# Patient Record
Sex: Female | Born: 1935 | Race: Black or African American | Hispanic: No
Health system: Southern US, Community
[De-identification: ages and names within clinical notes are randomized; demographics above are authoritative.]

## PROBLEM LIST (undated history)

## (undated) DIAGNOSIS — R42 Dizziness and giddiness: Secondary | ICD-10-CM

## (undated) DIAGNOSIS — E7800 Pure hypercholesterolemia, unspecified: Secondary | ICD-10-CM

## (undated) DIAGNOSIS — C9000 Multiple myeloma not having achieved remission: Secondary | ICD-10-CM

## (undated) DIAGNOSIS — F0390 Unspecified dementia without behavioral disturbance: Secondary | ICD-10-CM

## (undated) DIAGNOSIS — E559 Vitamin D deficiency, unspecified: Secondary | ICD-10-CM

## (undated) DIAGNOSIS — I509 Heart failure, unspecified: Secondary | ICD-10-CM

## (undated) DIAGNOSIS — F329 Major depressive disorder, single episode, unspecified: Secondary | ICD-10-CM

## (undated) DIAGNOSIS — D649 Anemia, unspecified: Secondary | ICD-10-CM

## (undated) DIAGNOSIS — F32A Depression, unspecified: Secondary | ICD-10-CM

## (undated) DIAGNOSIS — N289 Disorder of kidney and ureter, unspecified: Secondary | ICD-10-CM

---

## 2012-10-14 ENCOUNTER — Other Ambulatory Visit: Payer: Self-pay | Admitting: Podiatry

## 2012-10-18 LAB — WOUND CULTURE

## 2012-12-16 ENCOUNTER — Other Ambulatory Visit: Payer: Self-pay | Admitting: Podiatry

## 2012-12-24 LAB — WOUND CULTURE

## 2013-07-17 ENCOUNTER — Inpatient Hospital Stay: Payer: Self-pay | Admitting: Internal Medicine

## 2013-07-17 LAB — COMPREHENSIVE METABOLIC PANEL
ALK PHOS: 65 U/L
ANION GAP: 3 — AB (ref 7–16)
AST: 29 U/L (ref 15–37)
Albumin: 2.9 g/dL — ABNORMAL LOW (ref 3.4–5.0)
BILIRUBIN TOTAL: 0.2 mg/dL (ref 0.2–1.0)
BUN: 16 mg/dL (ref 7–18)
Calcium, Total: 8.5 mg/dL (ref 8.5–10.1)
Chloride: 107 mmol/L (ref 98–107)
Co2: 29 mmol/L (ref 21–32)
Creatinine: 1.2 mg/dL (ref 0.60–1.30)
EGFR (African American): 49 — ABNORMAL LOW
GFR CALC NON AF AMER: 42 — AB
Glucose: 77 mg/dL (ref 65–99)
OSMOLALITY: 278 (ref 275–301)
POTASSIUM: 4.1 mmol/L (ref 3.5–5.1)
SGPT (ALT): 21 U/L (ref 12–78)
SODIUM: 139 mmol/L (ref 136–145)
TOTAL PROTEIN: 7.2 g/dL (ref 6.4–8.2)

## 2013-07-17 LAB — CBC
HCT: 33.3 % — AB (ref 35.0–47.0)
HGB: 10.1 g/dL — ABNORMAL LOW (ref 12.0–16.0)
MCH: 25.6 pg — ABNORMAL LOW (ref 26.0–34.0)
MCHC: 30.5 g/dL — AB (ref 32.0–36.0)
MCV: 84 fL (ref 80–100)
Platelet: 225 10*3/uL (ref 150–440)
RBC: 3.96 10*6/uL (ref 3.80–5.20)
RDW: 14.1 % (ref 11.5–14.5)
WBC: 4.6 10*3/uL (ref 3.6–11.0)

## 2013-07-17 LAB — URINALYSIS, COMPLETE
BLOOD: NEGATIVE
Bacteria: NONE SEEN
Bilirubin,UR: NEGATIVE
Glucose,UR: NEGATIVE mg/dL (ref 0–75)
Hyaline Cast: 2
KETONE: NEGATIVE
Leukocyte Esterase: NEGATIVE
NITRITE: NEGATIVE
Ph: 7 (ref 4.5–8.0)
Protein: NEGATIVE
RBC,UR: 7 /HPF (ref 0–5)
Specific Gravity: 1.018 (ref 1.003–1.030)
Squamous Epithelial: <1

## 2013-07-18 LAB — TSH: THYROID STIMULATING HORM: 1.31 u[IU]/mL

## 2013-07-18 LAB — CBC WITH DIFFERENTIAL/PLATELET
BASOS ABS: 0.1 10*3/uL (ref 0.0–0.1)
Basophil %: 1.2 %
Eosinophil #: 0.2 10*3/uL (ref 0.0–0.7)
Eosinophil %: 4.5 %
HCT: 32.6 % — AB (ref 35.0–47.0)
HGB: 10.1 g/dL — ABNORMAL LOW (ref 12.0–16.0)
LYMPHS ABS: 1.4 10*3/uL (ref 1.0–3.6)
Lymphocyte %: 33.2 %
MCH: 26.2 pg (ref 26.0–34.0)
MCHC: 31.1 g/dL — AB (ref 32.0–36.0)
MCV: 84 fL (ref 80–100)
Monocyte #: 0.5 x10 3/mm (ref 0.2–0.9)
Monocyte %: 11.7 %
NEUTROS PCT: 49.4 %
Neutrophil #: 2.1 10*3/uL (ref 1.4–6.5)
Platelet: 230 10*3/uL (ref 150–440)
RBC: 3.87 10*6/uL (ref 3.80–5.20)
RDW: 14.3 % (ref 11.5–14.5)
WBC: 4.3 10*3/uL (ref 3.6–11.0)

## 2013-07-18 LAB — MAGNESIUM: Magnesium: 1.9 mg/dL

## 2013-07-18 LAB — HEMOGLOBIN A1C: HEMOGLOBIN A1C: 6 % (ref 4.2–6.3)

## 2013-07-19 LAB — CBC WITH DIFFERENTIAL/PLATELET
Basophil #: 0.1 10*3/uL (ref 0.0–0.1)
Basophil %: 0.9 %
EOS ABS: 0.2 10*3/uL (ref 0.0–0.7)
EOS PCT: 3.7 %
HCT: 30.4 % — AB (ref 35.0–47.0)
HGB: 9.6 g/dL — ABNORMAL LOW (ref 12.0–16.0)
LYMPHS ABS: 2.1 10*3/uL (ref 1.0–3.6)
Lymphocyte %: 34.5 %
MCH: 26.4 pg (ref 26.0–34.0)
MCHC: 31.5 g/dL — ABNORMAL LOW (ref 32.0–36.0)
MCV: 84 fL (ref 80–100)
MONO ABS: 0.5 x10 3/mm (ref 0.2–0.9)
MONOS PCT: 8.7 %
Neutrophil #: 3.2 10*3/uL (ref 1.4–6.5)
Neutrophil %: 52.2 %
Platelet: 216 10*3/uL (ref 150–440)
RBC: 3.63 10*6/uL — ABNORMAL LOW (ref 3.80–5.20)
RDW: 14.4 % (ref 11.5–14.5)
WBC: 6.1 10*3/uL (ref 3.6–11.0)

## 2013-07-19 LAB — BASIC METABOLIC PANEL
Anion Gap: 6 — ABNORMAL LOW (ref 7–16)
BUN: 7 mg/dL (ref 7–18)
CHLORIDE: 113 mmol/L — AB (ref 98–107)
CO2: 24 mmol/L (ref 21–32)
Calcium, Total: 7.8 mg/dL — ABNORMAL LOW (ref 8.5–10.1)
Creatinine: 0.97 mg/dL (ref 0.60–1.30)
EGFR (Non-African Amer.): 54 — ABNORMAL LOW
Glucose: 88 mg/dL (ref 65–99)
OSMOLALITY: 282 (ref 275–301)
Potassium: 4 mmol/L (ref 3.5–5.1)
Sodium: 143 mmol/L (ref 136–145)

## 2013-07-20 LAB — VANCOMYCIN, TROUGH: VANCOMYCIN, TROUGH: 6 ug/mL — AB (ref 10–20)

## 2013-07-21 LAB — WOUND AEROBIC CULTURE

## 2013-07-22 LAB — CULTURE, BLOOD (SINGLE)

## 2013-07-22 LAB — PATHOLOGY REPORT

## 2013-07-23 LAB — WOUND CULTURE

## 2014-07-30 NOTE — Consult Note (Signed)
PATIENT NAME:  Carmen Christian, Carmen Christian MR#:  [id number] DATE OF BIRTH:  05-03-1928  DATE OF CONSULTATION:  07/17/2013  CONSULTING PHYSICIAN:  Sharlotte Alamo, DPM  REASON FOR CONSULTATION: This is an 81 year old female who was recently admitted to the hospital with an infection in her right great toe. She has been managed outpatient for chronic osteomyelitis by Dr. Elvina Mattes. Has been on long-term Bactrim. She did finish her course a while back but recently within the past week the toe started to get red and inflamed again. She started back on her Bactrim 4 or 5 days ago, but the toe continues to get worse. She was admitted and x-rays and MRI showed osteomyelitis of the right great toe.   PAST MEDICAL HISTORY: Hyperlipidemia, rheumatoid arthritis, multiple myeloma, glaucoma.   PAST SURGICAL HISTORY: Cataracts.   MEDICATIONS: List is reviewed in the chart.   ALLERGIES: No known drug allergies.   FAMILY HISTORY: Heart disease, hypertension, diabetes.   SOCIAL HISTORY: Denies alcohol or tobacco use.   REVIEW OF SYSTEMS: Denies any specific fever or chills but states she does feel cold. No headaches or dizziness. Denies any specific numbness or paresthesias. Does have some swelling and redness in her right great toe. She denies any injury. Denies any nausea of or vomiting. No cough or shortness of breath. No chest pains.   PHYSICAL EXAMINATION: VASCULAR: DP and PT pulses are palpable 1/4. Capillary filling time is intact.  NEUROLOGICAL: Epicritic sensations appear grossly intact.  INTEGUMENT: Skin is warm, dry, and supple. There is a large abscessed area on the dorsal aspect of the right great toe at the IPJ. Some skin slough noted along the dorsal aspect of the toe.  MUSCULOSKELETAL: Adequate range of motion. Muscle testing is 5/5.   X-RAYS: Multiple views of the right great toe taken yesterday reveal cortical destruction consistent with osteomyelitis in the distal phalanx of the right great toe. No evidence  of any acute fracture. MRI also showed changes consistent with osteomyelitis in the distal phalanx. Some edema also noted in the proximal phalanx but there was no cortical erosion noted.   IMPRESSION: Osteomyelitis, right great toe.   PLAN: The abscess was opened up and a culture was taken for sensitivities. I discussed with the patient the need for amputation of her great toe. We discussed that this may require multiple procedures as the skin is not in very good shape along the top of her toe. The patient has not had breakfast yet this morning so we will keep her n.p.o. Consent for amputation of the right great toe. We will plan for procedure later on this morning.     Sharlotte Alamo, DPM tc:sg D: 07/18/2013 08:15:31 ET T: 07/18/2013 09:49:04 ET JOB#: [id number]  cc: Sharlotte Alamo, DPM, <Dictator> Jaeger Trueheart DPM ELECTRONICALLY SIGNED 07/27/2013 11:36

## 2014-07-30 NOTE — Op Note (Signed)
PATIENT NAME:  ODILIA, DAMICO MR#:  [id number] DATE OF BIRTH:  1929-03-09  DATE OF OPERATION:  07/18/2013  SURGEON:  Durward Fortes, DPM  PREOPERATIVE DIAGNOSIS: Osteomyelitis, right great toe.   POSTOPERATIVE DIAGNOSIS:  Osteomyelitis, right great toe.  PROCEDURE: Amputation, right great toe.   ANESTHESIA: LMA with local.  HEMOSTASIS: Pneumatic tourniquet right ankle, 250 mmHg.   ESTIMATED BLOOD LOSS: Minimal.   PATHOLOGY: Right great toe.   CULTURES: Bone cultures right distal phalanx, hallux.   IMPLANTS: Stimulan rapid cure beads with vancomycin.   DRAINS: None.   COMPLICATIONS: None apparent.   OPERATIVE INDICATIONS: This is an 81 year old female with a chronic history of osteomyelitis in her right great toe. This has been managed outpatient with oral antibiotics, but just recently flared up, requiring admission to the hospital and amputation.   OPERATIVE PROCEDURE: The patient was taken to the Operating Room and placed on the table in the supine position. Following satisfactory LMA anesthesia, the right foot was anesthetized with 10 mL of 0.5% Sensorcaine plain around the first metatarsal. A pneumatic tourniquet was applied at the level of the right ankle, and the foot was prepped and draped in the usual sterile fashion. The foot was exsanguinated and the tourniquet inflated to 250 mmHg.   Attention was then directed to the distal aspect of the right foot, where an elliptical incision fish mouth-type was made around the base of the proximal phalanx on the great toe. This was carried sharply down to bone. There was noted to be some gross infection from the distal phalanx and down along the plantar tissues. Dissection was carried back to the base of the proximal phalanx, where the base was incised using a pneumatic saw and the distal aspect of the toe was removed. The metatarsophalangeal joint was left intact. Next, using a VersaJet debrider on a setting of 7, there was excisional  debridement of the devitalized tissue from the dorsal and plantar flaps. Full thickness through the deep subcutaneous tissue all the way down to the level of bone. The wound was then flushed and irrigated with the VersaJet on a setting of 3. Some superficial skin slough along the dorsal flap was also excisionally debrided on a setting of 3. Next, Stimulan rapid cure beads impregnated with vancomycin were then placed into the open wound, and the edges were coapted and sutured using 4-0 nylon simple interrupted sutures. Xeroform and sterile bandages were applied. Tourniquet was released. ABD and Kerlix were then applied, followed by an Ace wrap. The patient tolerated the procedure and anesthesia well, and was transported to the PACU with vital signs stable and in good condition.    ____________________________ Sharlotte Alamo, DPM tc:mr D: 07/18/2013 11:26:25 ET T: 07/18/2013 19:41:31 ET JOB#: [id number]  cc: Sharlotte Alamo, DPM, <Dictator> Dezzie Badilla DPM ELECTRONICALLY SIGNED 07/27/2013 11:36

## 2014-07-30 NOTE — H&P (Signed)
PATIENT NAME:  Carmen Christian, SINQUEFIELD MR#:  [id number] DATE OF BIRTH:  [birth year]  DATE OF ADMISSION:  07/17/2013  PRIMARY CARE PHYSICIAN:  Nonlocal.   REFERRING PHYSICIAN:  Dr. Bonney Roussel.   CHIEF COMPLAINT: Right great toe tenderness and swelling for 4 days.   HISTORY OF PRESENT ILLNESS:  81 year old African American female with a history of hyperlipidemia, rheumatoid arthritis, multiple myeloma, glaucoma presented to the ED with the above chief complaint. The patient is alert, awake, oriented, in no acute distress. The patient had complaints of right great toe tenderness and swelling for the past 4 days. She has been following up with Dr. Elvina Mattes, podiatrist. She was given antibiotics 4 days ago, but the right great toe tenderness, swelling has been worsening. The patient denies any fever or chills. No other symptoms. The patient foot x-ray showed possible osteomyelitis. The patient was treated with Zosyn  and went to ED.   PAST MEDICAL HISTORY: Hyperlipidemia, rheumatoid arthritis, multiple myeloma, glaucoma.   PAST SURGICAL HISTORY:  Cataract surgery.   SOCIAL HISTORY: No smoking or drinking or illicit drugs.   FAMILY HISTORY: Mother and sister had heart attack. Brother has hypertension and diabetes.   ALLERGIES:  None.   HOME MEDICATIONS:  1.  Vitamin D2 50,000 international units 1 capsule twice a week.  2.  Tramadol 50 mg p.o. 1-2 tablets every 6 hours p.r.n.  3.  Bactrim DS 1 tablet p.o. b.i.d.  4.  Zocor 40 mg p.o. at bedtime.  5.  Mupirocin 2% topical cream applied topically to affected area 3 times a day.  6.  Integra 1 tablet once a day.  7.  Lasix 20 mg p.o. once a day p.r.n.   REVIEW OF SYSTEMS:   CONSTITUTIONAL: The patient denies any fever or chills. No headache or dizziness. No weakness.  EYES: No double vision or blurry vision.  EARS, NOSE AND THROAT: No postnasal drip, slurred speech or dysphagia.  CARDIOVASCULAR: No chest pain, palpitation, orthopnea, nocturnal dyspnea. No  leg edema.  PULMONARY: No cough, sputum, shortness of breath or hemoptysis.  GASTROINTESTINAL: No abdominal pain, nausea, vomiting, diarrhea. No melena or bloody stool.  GENITOURINARY: No dysuria, hematuria, or incontinence.  SKIN: No rash or jaundice.  NEUROLOGY: No syncope,  loss of consciousness or seizure.  HEMATOLOGY: No easy bleeding or bleeding.  ENDOCRINOLOGY: No polyuria, polydipsia, heat, or cold intolerance.   PHYSICAL EXAMINATION:  VITAL SIGNS: Temperature 97.8, blood pressure 108/62, pulse 54, respirations 18, oxygen saturation 93% on room air.  GENERAL: The patient is alert, awake, oriented acute distress.  EARS, NOSE AND THROAT:  Pupils round, equal and reactive to light in accommodation. Moist oral mucosa. Clear oropharynx.  NECK: Supple. No JVD or carotid bruits. No lymphadenopathy. No thyromegaly.  CARDIOVASCULAR: S1, S2, regular rate and rhythm. No murmurs or gallops.  PULMONARY: Bilateral air entry. No wheezing or rales. No use of accessory muscle to breathe.  GASTROINTESTINAL:  Abdomen soft. No distention. No tenderness. No organomegaly. Bowel sounds present.  EXTREMITIES: No edema, clubbing, or cyanosis. No calf tenderness. Bilateral pedal pulses present. Right great toe swelling, tenderness in dressing.   NEUROLOGY: A and O x 3. No focal deficit. Power 5/5. Sensory intact.   LABORATORY DATA:  Right foot x-ray showed destructive changes of distal phalanx of the right great toe worrisome for clinically suspected osteomyelitis.   WBC 4.6, hemoglobin 10.1, platelets 225,000. Glucose 77, BUN 16, creatinine 1.2. Electrolytes normal.   IMPRESSIONS:  1.  Right great toe osteomyelitis.  2.  Anemia.  3.  Hyperlipidemia.  4.  Rheumatoid arthritis.  5.  Multiple myeloma.   PLAN OF TREATMENT:  1.  The patient will be admitted to the medical floor. We will continue Zosyn and vancomycin IVPB. We will get a podiatry consult for possible procedure and surgery.  2.  The patient  may need an MRI foot.  3.  We will follow up CBC, blood culture.  4.  I discussed the patient's condition and plan of treatment with the patient and the patient's friend. The patient has no health power of attorney.   CODE STATUS:  The patient wants full code.   TIME SPENT: About 52 minutes.   ____________________________ Demetrios Loll, MD qc:tc D: 07/17/2013 13:45:47 ET T: 07/17/2013 14:53:08 ET JOB#: [id number]  cc: Demetrios Loll, MD, <Dictator> Demetrios Loll MD ELECTRONICALLY SIGNED 07/18/2013 10:37

## 2014-07-30 NOTE — Discharge Summary (Signed)
PATIENT NAME:  Carmen Christian, Carmen Christian MR#:  [id number] DATE OF BIRTH:  14-Apr-1928  DATE OF ADMISSION:  07/17/2013 DATE OF DISCHARGE:  07/21/2013  ADMITTING DIAGNOSIS: Right great toe tenderness and swelling for the past few days.   DISCHARGE DIAGNOSES:  1. Osteomyelitis of the right great toe, status post amputation of the right great toe.  2. Chronic anemia.  3. Hyperlipidemia.  4. Rheumatoid arthritis.  5. Multiple myeloma.   PERTINENT LABORATORIES AND EVALUATIONS: Admitting glucose 77, BUN 16, creatinine 1.20, sodium 139, potassium is 4.1, chloride 107, CO2 is 29. Hemoglobin A1c 6.0. LFTs: Total protein 7.2, albumin 2.9, bilirubin total was 0.2, alkaline phosphatase is 65. TSH 1.31. WBC 4.6, hemoglobin 10.1, platelet count is 225. Blood cultures x2: No growth. Wound culture: Showed no growth, a few white blood cells, rare gram-positive cocci in clusters. Wound cultures intraoperatively holding for possible anaerobes, appears to be normal flora.   HOSPITAL COURSE: Please refer to H and P done by the admitting physician. The patient is an 81 year old African-American female who resides by herself, with a history of hyperlipidemia, rheumatoid arthritis, multiple myeloma, glaucoma, who presented to the ED with complaint of right great toe tenderness and swelling. She has been followed by Dr. Elvina Mattes of podiatry and was given antibiotics. However, symptoms persisted. She came to the ED and was thought to have possible osteomyelitis. We were asked to admit the patient. The patient was started on broad-spectrum antibiotics and further evaluation and treatment. The patient was seen by podiatry, and they recommended the patient have this amputated. She underwent amputation of the right great toe without any complications. She was kept on antibiotics, was followed throughout the hospitalization. Cultures did not show any growth. She will be empirically treated with Bactrim. At this time, she is very weak and  deconditioned and needs further rehab and therapy. At this time, she is stable for discharge.   DISCHARGE MEDICATIONS:  1. Simvastatin 40 at bedtime. 2. Integra 1 tab p.o. daily. 3. Vitamin D2 50,000 international units 1 cap 2 times a week. 4. Bactrim 1 tab p.o. b.i.d. for 10 days. 5. Mupirocin topically apply to affected area 3 times a day.  6. Acetaminophen/hydrocodone 325/5 mg 1 tab p.o. q.8 p.r.n. for pain.  DRESSING CARE: Will be addressed per podiatry.   DIET: Low sodium.   ACTIVITY: As tolerated.   FOLLOWUP: With Dr. Cleda Mccreedy of podiatry, in 1 to 2 weeks with primary MD.  TIME SPENT: 35 minutes.   ____________________________ Lafonda Mosses Posey Pronto, MD shp:lb D: 07/21/2013 14:42:33 ET T: 07/21/2013 14:51:01 ET JOB#: [id number]  cc: Danise Dehne H. Posey Pronto, MD, <Dictator> Alric Seton MD ELECTRONICALLY SIGNED 07/22/2013 14:49

## 2017-03-03 ENCOUNTER — Encounter (HOSPITAL_COMMUNITY): Payer: Self-pay | Admitting: Emergency Medicine

## 2017-03-03 ENCOUNTER — Emergency Department (HOSPITAL_COMMUNITY)
Admission: EM | Admit: 2017-03-03 | Discharge: 2017-03-03 | Disposition: A | Discharge status: Home / Self Care | Payer: Medicare Other | Attending: Emergency Medicine | Admitting: Emergency Medicine

## 2017-03-03 ENCOUNTER — Other Ambulatory Visit: Payer: Self-pay

## 2017-03-03 DIAGNOSIS — Y999 Unspecified external cause status: Secondary | ICD-10-CM | POA: Insufficient documentation

## 2017-03-03 DIAGNOSIS — X58XXXA Exposure to other specified factors, initial encounter: Secondary | ICD-10-CM | POA: Diagnosis not present

## 2017-03-03 DIAGNOSIS — Y929 Unspecified place or not applicable: Secondary | ICD-10-CM | POA: Insufficient documentation

## 2017-03-03 DIAGNOSIS — F0390 Unspecified dementia without behavioral disturbance: Secondary | ICD-10-CM | POA: Insufficient documentation

## 2017-03-03 DIAGNOSIS — Y9389 Activity, other specified: Secondary | ICD-10-CM | POA: Diagnosis not present

## 2017-03-03 DIAGNOSIS — Z79899 Other long term (current) drug therapy: Secondary | ICD-10-CM | POA: Insufficient documentation

## 2017-03-03 DIAGNOSIS — R0989 Other specified symptoms and signs involving the circulatory and respiratory systems: Secondary | ICD-10-CM | POA: Diagnosis present

## 2017-03-03 DIAGNOSIS — T17320A Food in larynx causing asphyxiation, initial encounter: Secondary | ICD-10-CM | POA: Insufficient documentation

## 2017-03-03 HISTORY — DX: Unspecified dementia, unspecified severity, without behavioral disturbance, psychotic disturbance, mood disturbance, and anxiety: F03.90

## 2017-03-03 HISTORY — DX: Multiple myeloma not having achieved remission: C90.00

## 2017-03-03 HISTORY — DX: Disorder of kidney and ureter, unspecified: N28.9

## 2017-03-03 HISTORY — DX: Depression, unspecified: F32.A

## 2017-03-03 HISTORY — DX: Major depressive disorder, single episode, unspecified: F32.9

## 2017-03-03 HISTORY — DX: Pure hypercholesterolemia, unspecified: E78.00

## 2017-03-03 NOTE — ED Triage Notes (Signed)
Pt was choking at [facility]. Himlic Maneuver performed successfully by First responders. Pt has no complaints.

## 2017-03-03 NOTE — ED Notes (Signed)
Pt given water per fluid challenge. Pt drinking with no complaints and no noted distress.

## 2017-03-03 NOTE — ED Provider Notes (Signed)
[facility] EMERGENCY DEPARTMENT Provider Note   CSN: [id number] Arrival date & time: 03/03/17  1735     History   Chief Complaint Chief Complaint  Patient presents with  . Choking    HPI Carmen Christian is a 83 y.o. female.  HPI Patient had a choking episode while at assisted living.  Reportedly was eating chicken and had it get caught in her throat.  Had trouble swallowing.  Reportedly got Heimlich maneuver.  Patient states that then coughed up.  Then feels better.  No difficulty breathing or difficulty swallowing now.  No chest pain.  Has not had this happen before. Past Medical History:  Diagnosis Date  . Dementia   . Depression   . High cholesterol   . Multiple myeloma ([place])   . Renal disorder     There are no active problems to display for this patient.     OB History    No data available       Home Medications    Prior to Admission medications   Medication Sig Start Date End Date Taking? Authorizing Provider  Acetaminophen (TYLENOL ARTHRITIS EXT RELIEF PO) Take 650 mg by mouth daily as needed (for mild pain).   Yes [provider]  albuterol (PROVENTIL HFA;VENTOLIN HFA) 108 (90 Base) MCG/ACT inhaler Inhale 2 puffs into the lungs every 6 (six) hours as needed for wheezing or shortness of breath.   Yes [provider]  dexamethasone (DECADRON) 4 MG tablet Take 4 mg by mouth every 14 (fourteen) days.   Yes [provider]  divalproex (DEPAKOTE) 125 MG DR tablet Take 125 mg by mouth 3 (three) times daily.   Yes [provider]  Fe Fum-FePoly-FA-Vit C-Vit B3 (INTEGRA F) 125-1 MG CAPS Take 1 capsule by mouth daily.   Yes [provider]  furosemide (LASIX) 20 MG tablet Take 20 mg by mouth daily.   Yes [provider]  gabapentin (NEURONTIN) 300 MG capsule Take 300 mg by mouth 3 (three) times daily.   Yes [provider]  meclizine (ANTIVERT) 25 MG tablet Take 25 mg by mouth every 6 (six) hours as needed  for dizziness.   Yes [provider]  sertraline (ZOLOFT) 25 MG tablet Take 25 mg by mouth daily.   Yes [provider]  simvastatin (ZOCOR) 40 MG tablet Take 40 mg by mouth at bedtime.   Yes [provider]  Skin Protectants, Misc. (MINERIN) CREA Apply 1 application topically daily as needed (to lower legs for dry skin).   Yes [provider]  traZODone (DESYREL) 50 MG tablet Take 50 mg by mouth at bedtime.   Yes [provider]  Vitamin D, Ergocalciferol, (DRISDOL) 50000 units CAPS capsule Take 50,000 Units by mouth 2 (two) times a week. Mondays and Thursdays in the morning   Yes [provider]  zinc oxide 20 % ointment Apply 1 application topically 3 (three) times daily as needed for irritation (applied to bilateral buttocks).   Yes [provider]    Family History No family history on file.  Social History Social History   Tobacco Use  . Smoking status: Unknown If Ever Smoked  Substance Use Topics  . Alcohol use: Not on file  . Drug use: Not on file     Allergies   Patient has no known allergies.   Review of Systems Review of Systems  Constitutional: Negative for appetite change.  Respiratory: Negative for shortness of breath.   Cardiovascular: Negative  for chest pain.  Gastrointestinal: Negative for abdominal pain and vomiting.  Genitourinary: Negative for flank pain.  Musculoskeletal: Negative for back pain.  Skin: Negative for rash.  Neurological: Negative for weakness.  Hematological: Negative for adenopathy.  Psychiatric/Behavioral: Negative for confusion.     Physical Exam Updated Vital Signs BP 139/76   Pulse (!) 51   Temp 97.8 F (36.6 C)   Resp 18   Ht _0  (1.575 m)   Wt 54.4 kg (120 lb)   SpO2 96%   BMI 21.95 kg/m   Physical Exam  Constitutional: She appears well-developed.  HENT:  Head: Atraumatic.  Eyes: Pupils are equal, round, and reactive to light.  Neck: Neck supple.    Cardiovascular: Normal rate.  Pulmonary/Chest: She exhibits no tenderness.  Abdominal: There is no tenderness.  Musculoskeletal: She exhibits no tenderness.  Neurological: She is alert.  Skin: Skin is warm. Capillary refill takes less than 2 seconds.  Psychiatric: She has a normal mood and affect.     ED Treatments / Results  Labs (all labs ordered are listed, but only abnormal results are displayed) Labs Reviewed - No data to display  EKG  EKG Interpretation None       Radiology No results found.  Procedures Procedures (including critical care time)  Medications Ordered in ED Medications - No data to display   Initial Impression / Assessment and Plan / ED Course  I have reviewed the triage vital signs and the nursing notes.  Pertinent labs & imaging results that were available during my care of the patient were reviewed by me and considered in my medical decision making (see chart for details).     Patient with choking episode.  Got Heimlich maneuver at assisted living.  Back at baseline.  Able to tolerate orals here.  Well-appearing.  Will discharge home.  Final Clinical Impressions(s) / ED Diagnoses   Final diagnoses:  Choking due to food in larynx, initial encounter    ED Discharge Orders    None       Davonna Belling, MD 03/03/17 [phone]

## 2017-05-22 ENCOUNTER — Encounter (HOSPITAL_COMMUNITY): Payer: Self-pay | Admitting: Emergency Medicine

## 2017-05-22 ENCOUNTER — Emergency Department (HOSPITAL_COMMUNITY): Payer: Medicare Other

## 2017-05-22 ENCOUNTER — Emergency Department (HOSPITAL_COMMUNITY)
Admission: EM | Admit: 2017-05-22 | Discharge: 2017-05-22 | Disposition: A | Discharge status: Home / Self Care | Payer: Medicare Other | Attending: Emergency Medicine | Admitting: Emergency Medicine

## 2017-05-22 DIAGNOSIS — J40 Bronchitis, not specified as acute or chronic: Secondary | ICD-10-CM | POA: Insufficient documentation

## 2017-05-22 DIAGNOSIS — F0390 Unspecified dementia without behavioral disturbance: Secondary | ICD-10-CM | POA: Insufficient documentation

## 2017-05-22 DIAGNOSIS — Z79899 Other long term (current) drug therapy: Secondary | ICD-10-CM | POA: Insufficient documentation

## 2017-05-22 DIAGNOSIS — R05 Cough: Secondary | ICD-10-CM | POA: Diagnosis present

## 2017-05-22 LAB — INFLUENZA PANEL BY PCR (TYPE A & B)
INFLAPCR: NEGATIVE
Influenza B By PCR: NEGATIVE

## 2017-05-22 LAB — CBC WITH DIFFERENTIAL/PLATELET
Basophils Absolute: 0 10*3/uL (ref 0.0–0.1)
Basophils Relative: 0 %
EOS PCT: 7 %
Eosinophils Absolute: 0.6 10*3/uL (ref 0.0–0.7)
HCT: 32.9 % — ABNORMAL LOW (ref 36.0–46.0)
Hemoglobin: 10 g/dL — ABNORMAL LOW (ref 12.0–15.0)
LYMPHS PCT: 29 %
Lymphs Abs: 2.8 10*3/uL (ref 0.7–4.0)
MCH: 26.7 pg (ref 26.0–34.0)
MCHC: 30.4 g/dL (ref 30.0–36.0)
MCV: 88 fL (ref 78.0–100.0)
MONO ABS: 0.9 10*3/uL (ref 0.1–1.0)
MONOS PCT: 10 %
Neutro Abs: 5.2 10*3/uL (ref 1.7–7.7)
Neutrophils Relative %: 54 %
PLATELETS: 203 10*3/uL (ref 150–400)
RBC: 3.74 MIL/uL — ABNORMAL LOW (ref 3.87–5.11)
RDW: 14.5 % (ref 11.5–15.5)
WBC: 9.6 10*3/uL (ref 4.0–10.5)

## 2017-05-22 LAB — COMPREHENSIVE METABOLIC PANEL
ALT: 14 U/L (ref 14–54)
AST: 21 U/L (ref 15–41)
Albumin: 2.9 g/dL — ABNORMAL LOW (ref 3.5–5.0)
Alkaline Phosphatase: 62 U/L (ref 38–126)
Anion gap: 11 (ref 5–15)
BILIRUBIN TOTAL: 0.5 mg/dL (ref 0.3–1.2)
BUN: 24 mg/dL — ABNORMAL HIGH (ref 6–20)
CHLORIDE: 106 mmol/L (ref 101–111)
CO2: 28 mmol/L (ref 22–32)
CREATININE: 0.95 mg/dL (ref 0.44–1.00)
Calcium: 8.5 mg/dL — ABNORMAL LOW (ref 8.9–10.3)
GFR calc Af Amer: >60 mL/min (ref >60)
GFR, EST NON AFRICAN AMERICAN: 53 mL/min — AB (ref >60)
Glucose, Bld: 101 mg/dL — ABNORMAL HIGH (ref 65–99)
POTASSIUM: 4.4 mmol/L (ref 3.5–5.1)
Sodium: 145 mmol/L (ref 135–145)
TOTAL PROTEIN: 6.9 g/dL (ref 6.5–8.1)

## 2017-05-22 NOTE — ED Triage Notes (Signed)
Pt sent from [facility] for eval of cough/congestion and possible fever.  Started on Amoxil 2 days ago for URI with no change.

## 2017-05-22 NOTE — Discharge Instructions (Signed)
Continue taking your antibiotic and follow-up with your primary care doctor next week

## 2017-05-22 NOTE — ED Provider Notes (Signed)
[facility] EMERGENCY DEPARTMENT Provider Note   CSN: [id number] Arrival date & time: 05/22/17  1735     History   Chief Complaint Chief Complaint  Patient presents with  . Cough    HPI Carmen Christian is a 84 y.o. female.  Patient complains of a cough.  She is placed on amoxicillin couple days ago   The history is provided by the patient. No language interpreter was used.  Cough  This is a new problem. The current episode started 2 days ago. The problem occurs constantly. The problem has not changed since onset.The cough is non-productive. There has been no fever. Pertinent negatives include no chest pain and no headaches. The treatment provided no relief.    Past Medical History:  Diagnosis Date  . Dementia   . Depression   . High cholesterol   . Multiple myeloma ([place])   . Renal disorder     There are no active problems to display for this patient.   History reviewed. No pertinent surgical history.  OB History    No data available       Home Medications    Prior to Admission medications   Medication Sig Start Date End Date Taking? Authorizing Provider  Acetaminophen (TYLENOL ARTHRITIS EXT RELIEF PO) Take 650 mg by mouth daily as needed (for mild pain).   Yes [provider]  albuterol (PROVENTIL HFA;VENTOLIN HFA) 108 (90 Base) MCG/ACT inhaler Inhale 2 puffs into the lungs every 6 (six) hours as needed for wheezing or shortness of breath.   Yes [provider]  amoxicillin (AMOXIL) 500 MG capsule Take 500 mg by mouth 3 (three) times daily. 10 day course starting on 05/20/17   Yes [provider]  dexamethasone (DECADRON) 4 MG tablet Take 4 mg by mouth every 14 (fourteen) days.   Yes [provider]  diclofenac sodium (VOLTAREN) 1 % GEL Apply 2 g topically 4 (four) times daily.   Yes [provider]  divalproex (DEPAKOTE) 125 MG DR tablet Take 125 mg by mouth 3 (three) times daily.   Yes [provider]  Fe  Fum-FePoly-FA-Vit C-Vit B3 (INTEGRA F) 125-1 MG CAPS Take 1 capsule by mouth daily.   Yes [provider]  furosemide (LASIX) 20 MG tablet Take 20 mg by mouth daily.   Yes [provider]  gabapentin (NEURONTIN) 300 MG capsule Take 300 mg by mouth 3 (three) times daily.   Yes [provider]  meclizine (ANTIVERT) 25 MG tablet Take 25 mg by mouth every 6 (six) hours as needed for dizziness.   Yes [provider]  sertraline (ZOLOFT) 50 MG tablet Take 50 mg by mouth daily.    Yes [provider]  simvastatin (ZOCOR) 40 MG tablet Take 40 mg by mouth at bedtime.   Yes [provider]  Skin Protectants, Misc. (MINERIN) CREA Apply 1 application topically daily as needed (to lower legs for dry skin).   Yes [provider]  traZODone (DESYREL) 50 MG tablet Take 50 mg by mouth at bedtime.   Yes [provider]  Vitamin D, Ergocalciferol, (DRISDOL) 50000 units CAPS capsule Take 50,000 Units by mouth 2 (two) times a week. Mondays and Thursdays in the morning   Yes [provider]  zinc oxide 20 % ointment Apply 1 application topically 3 (three) times daily as needed for irritation (applied to bilateral buttocks).   Yes [provider]    Family History History reviewed. No pertinent family history.  Social History Social History   Tobacco Use  . Smoking status: Unknown If Ever Smoked  Substance Use Topics  . Alcohol use: No    Frequency: Never  . Drug use: Not on file     Allergies   Patient has no known allergies.   Review of Systems Review of Systems  Constitutional: Negative for appetite change and fatigue.  HENT: Negative for congestion, ear discharge and sinus pressure.   Eyes: Negative for discharge.  Respiratory: Positive for cough.   Cardiovascular: Negative for chest pain.  Gastrointestinal: Negative for abdominal pain and diarrhea.  Genitourinary: Negative for frequency and hematuria.    Musculoskeletal: Negative for back pain.  Skin: Negative for rash.  Neurological: Negative for seizures and headaches.  Psychiatric/Behavioral: Negative for hallucinations.     Physical Exam Updated Vital Signs BP 108/66   Pulse 74   Temp 99.2 F (37.3 C) (Oral)   Resp 14   Ht 5' 6" (1.676 m)   Wt 81.6 kg (180 lb)   SpO2 92%   BMI 29.05 kg/m   Physical Exam  Constitutional: She is oriented to person, place, and time. She appears well-developed.  HENT:  Head: Normocephalic.  Eyes: Conjunctivae and EOM are normal. No scleral icterus.  Neck: Neck supple. No thyromegaly present.  Cardiovascular: Normal rate and regular rhythm. Exam reveals no gallop and no friction rub.  No murmur heard. Pulmonary/Chest: No stridor. She has no wheezes. She has no rales. She exhibits no tenderness.  Abdominal: She exhibits no distension. There is no tenderness. There is no rebound.  Musculoskeletal: Normal range of motion. She exhibits no edema.  Lymphadenopathy:    She has no cervical adenopathy.  Neurological: She is oriented to person, place, and time. She exhibits normal muscle tone. Coordination normal.  Skin: No rash noted. No erythema.  Psychiatric: She has a normal mood and affect. Her behavior is normal.     ED Treatments / Results  Labs (all labs ordered are listed, but only abnormal results are displayed) Labs Reviewed  CBC WITH DIFFERENTIAL/PLATELET - Abnormal; Notable for the following components:      Result Value   RBC 3.74 (*)    Hemoglobin 10.0 (*)    HCT 32.9 (*)    All other components within normal limits  COMPREHENSIVE METABOLIC PANEL - Abnormal; Notable for the following components:   Glucose, Bld 101 (*)    BUN 24 (*)    Calcium 8.5 (*)    Albumin 2.9 (*)    GFR calc non Af Amer 53 (*)    All other components within normal limits  INFLUENZA PANEL BY PCR (TYPE A & B)    EKG  EKG Interpretation None       Radiology Dg Chest 2 View  Result Date:  05/22/2017 CLINICAL DATA:  Cough and congestion with shortness of Breath EXAM: CHEST  2 VIEW COMPARISON:  None. FINDINGS: Cardiac shadow is enlarged. Aortic calcifications are noted. Small pleural effusions are noted bilaterally. Mild right basilar atelectatic changes are seen. Degenerative changes about the shoulder joints as well as in the thoracic spine are noted. IMPRESSION: Mild right basilar atelectatic changes and small pleural effusions. Electronically Signed   By: Mark  Lukens M.D.   On: 05/22/2017 19:20    Procedures Procedures (including critical care time)  Medications Ordered in ED Medications - No data to display   Initial Impression / Assessment and Plan / ED Course  I have reviewed the triage vital signs and   the nursing notes.  Pertinent labs & imaging results that were available during my care of the patient were reviewed by me and considered in my medical decision making (see chart for details).     Patient with bronchitis.  She will continue the amoxicillin and follow-up her PCP  Final Clinical Impressions(s) / ED Diagnoses   Final diagnoses:  Bronchitis    ED Discharge Orders    None       , , MD 05/22/17 2102  

## 2017-05-22 NOTE — ED Notes (Signed)
Report called to [place] at [facility]. EMS called to transport pt.

## 2018-01-21 ENCOUNTER — Encounter (HOSPITAL_COMMUNITY): Payer: Self-pay

## 2018-01-21 ENCOUNTER — Other Ambulatory Visit: Payer: Self-pay

## 2018-01-21 ENCOUNTER — Emergency Department (HOSPITAL_COMMUNITY): Payer: Medicare Other

## 2018-01-21 ENCOUNTER — Emergency Department (HOSPITAL_COMMUNITY)
Admission: EM | Admit: 2018-01-21 | Discharge: 2018-01-22 | Disposition: A | Discharge status: Home / Self Care | Payer: Medicare Other | Attending: Emergency Medicine | Admitting: Emergency Medicine

## 2018-01-21 DIAGNOSIS — Y92122 Bedroom in nursing home as the place of occurrence of the external cause: Secondary | ICD-10-CM | POA: Insufficient documentation

## 2018-01-21 DIAGNOSIS — Y9389 Activity, other specified: Secondary | ICD-10-CM | POA: Diagnosis not present

## 2018-01-21 DIAGNOSIS — F0390 Unspecified dementia without behavioral disturbance: Secondary | ICD-10-CM | POA: Diagnosis not present

## 2018-01-21 DIAGNOSIS — F329 Major depressive disorder, single episode, unspecified: Secondary | ICD-10-CM | POA: Diagnosis not present

## 2018-01-21 DIAGNOSIS — Z79899 Other long term (current) drug therapy: Secondary | ICD-10-CM | POA: Diagnosis not present

## 2018-01-21 DIAGNOSIS — W0110XA Fall on same level from slipping, tripping and stumbling with subsequent striking against unspecified object, initial encounter: Secondary | ICD-10-CM | POA: Diagnosis not present

## 2018-01-21 DIAGNOSIS — Y998 Other external cause status: Secondary | ICD-10-CM | POA: Insufficient documentation

## 2018-01-21 DIAGNOSIS — L89622 Pressure ulcer of left heel, stage 2: Secondary | ICD-10-CM | POA: Insufficient documentation

## 2018-01-21 DIAGNOSIS — Z8579 Personal history of other malignant neoplasms of lymphoid, hematopoietic and related tissues: Secondary | ICD-10-CM | POA: Insufficient documentation

## 2018-01-21 DIAGNOSIS — S32502A Unspecified fracture of left pubis, initial encounter for closed fracture: Secondary | ICD-10-CM | POA: Diagnosis not present

## 2018-01-21 DIAGNOSIS — W19XXXA Unspecified fall, initial encounter: Secondary | ICD-10-CM

## 2018-01-21 DIAGNOSIS — S32592A Other specified fracture of left pubis, initial encounter for closed fracture: Secondary | ICD-10-CM

## 2018-01-21 DIAGNOSIS — S3993XA Unspecified injury of pelvis, initial encounter: Secondary | ICD-10-CM | POA: Diagnosis present

## 2018-01-21 DIAGNOSIS — R531 Weakness: Secondary | ICD-10-CM

## 2018-01-21 HISTORY — DX: Vitamin D deficiency, unspecified: E55.9

## 2018-01-21 HISTORY — DX: Heart failure, unspecified: I50.9

## 2018-01-21 HISTORY — DX: Dizziness and giddiness: R42

## 2018-01-21 HISTORY — DX: Anemia, unspecified: D64.9

## 2018-01-21 HISTORY — DX: Pure hypercholesterolemia, unspecified: E78.00

## 2018-01-21 LAB — CBC WITH DIFFERENTIAL/PLATELET
Abs Immature Granulocytes: 0.03 10*3/uL (ref 0.00–0.07)
BASOS PCT: 1 %
Basophils Absolute: 0.1 10*3/uL (ref 0.0–0.1)
EOS PCT: 4 %
Eosinophils Absolute: 0.3 10*3/uL (ref 0.0–0.5)
HCT: 32.3 % — ABNORMAL LOW (ref 36.0–46.0)
Hemoglobin: 9.5 g/dL — ABNORMAL LOW (ref 12.0–15.0)
Immature Granulocytes: 0 %
Lymphocytes Relative: 25 %
Lymphs Abs: 2.3 10*3/uL (ref 0.7–4.0)
MCH: 24.9 pg — AB (ref 26.0–34.0)
MCHC: 29.4 g/dL — ABNORMAL LOW (ref 30.0–36.0)
MCV: 84.8 fL (ref 80.0–100.0)
MONO ABS: 0.5 10*3/uL (ref 0.1–1.0)
MONOS PCT: 5 %
Neutro Abs: 6 10*3/uL (ref 1.7–7.7)
Neutrophils Relative %: 65 %
PLATELETS: 221 10*3/uL (ref 150–400)
RBC: 3.81 MIL/uL — ABNORMAL LOW (ref 3.87–5.11)
RDW: 18.6 % — AB (ref 11.5–15.5)
WBC: 9.1 10*3/uL (ref 4.0–10.5)
nRBC: 0 % (ref 0.0–0.2)

## 2018-01-21 LAB — BASIC METABOLIC PANEL
ANION GAP: 8 (ref 5–15)
BUN: 27 mg/dL — ABNORMAL HIGH (ref 8–23)
CALCIUM: 8.8 mg/dL — AB (ref 8.9–10.3)
CO2: 28 mmol/L (ref 22–32)
Chloride: 104 mmol/L (ref 98–111)
Creatinine, Ser: 0.99 mg/dL (ref 0.44–1.00)
GFR calc Af Amer: 58 mL/min — ABNORMAL LOW (ref >60)
GFR, EST NON AFRICAN AMERICAN: 50 mL/min — AB (ref >60)
GLUCOSE: 113 mg/dL — AB (ref 70–99)
Potassium: 4.3 mmol/L (ref 3.5–5.1)
Sodium: 140 mmol/L (ref 135–145)

## 2018-01-21 LAB — VALPROIC ACID LEVEL: Valproic Acid Lvl: 26 ug/mL — ABNORMAL LOW (ref 50.0–100.0)

## 2018-01-21 LAB — TROPONIN I: Troponin I: <0.03 ng/mL (ref <0.03)

## 2018-01-21 MED ORDER — CIPROFLOXACIN HCL 250 MG PO TABS: 250.0000 mg | ORAL_TABLET | Freq: Two times a day (BID) | ORAL | 0 refills | Status: DC

## 2018-01-21 MED ORDER — SODIUM CHLORIDE 0.9 % IV SOLN
INTRAVENOUS | Status: DC
  Administered 2018-01-21: 20:00:00 via INTRAVENOUS

## 2018-01-21 NOTE — ED Notes (Signed)
Patient still in Radiology

## 2018-01-21 NOTE — ED Notes (Signed)
No pacemaker 

## 2018-01-21 NOTE — Discharge Instructions (Addendum)
Take over-the-counter medications as needed for the pain, continue to apply a dressing to the heel and avoid pressure to that area, continue  your current medications

## 2018-01-21 NOTE — ED Notes (Signed)
Report given to Reggie, RN

## 2018-01-21 NOTE — ED Provider Notes (Signed)
[facility] EMERGENCY DEPARTMENT Provider Note   CSN: [id number] Arrival date & time: 01/21/18  1730     History   Chief Complaint Chief Complaint  Patient presents with  . Fall    HPI Carmen Christian is a 84 y.o. female.  HPI Pt is a resident of a nursing facility.  She has a history of demential limiting the history.  Patient was found on the floor next to her bed.  It is unclear what happened.  Per EMS the patient was complaining of pain and was noted to have a deformity of her left knee.  Patient denies any knee pain to me.  She complains of pain at her left heel.  History is somewhat difficult.  Patient does answer my questions but at times is mumbling and difficult to comprehend.  She denies any chest pain or shortness of breath.  She denies any headache or neck pain. Past Medical History:  Diagnosis Date  . Anemia   . CHF (congestive heart failure) ([place])   . Dementia ([place])   . Depression   . Dizziness   . High cholesterol   . Hypercholesterolemia   . Multiple myeloma ([place])   . Renal disorder   . Vitamin D deficiency     There are no active problems to display for this patient.   History reviewed. No pertinent surgical history.   OB History   None      Home Medications    Prior to Admission medications   Medication Sig Start Date End Date Taking? Authorizing Provider  diclofenac sodium (VOLTAREN) 1 % GEL Apply 2 g topically 4 (four) times daily.   Yes [provider]  divalproex (DEPAKOTE) 125 MG DR tablet Take 125 mg by mouth 3 (three) times daily.   Yes [provider]  Fe Fum-FePoly-FA-Vit C-Vit B3 (INTEGRA F) 125-1 MG CAPS Take 1 capsule by mouth daily.   Yes [provider]  furosemide (LASIX) 20 MG tablet Take 20 mg by mouth daily.   Yes [provider]  gabapentin (NEURONTIN) 300 MG capsule Take 300 mg by mouth 3 (three) times daily.   Yes [provider]  multivitamin-lutein (OCUVITE-LUTEIN) CAPS capsule  Take 1 capsule by mouth daily.   Yes [provider]  sertraline (ZOLOFT) 50 MG tablet Take 50 mg by mouth daily.    Yes [provider]  simvastatin (ZOCOR) 40 MG tablet Take 40 mg by mouth at bedtime.   Yes [provider]  Skin Protectants, Misc. (MINERIN) CREA Apply 1 application topically daily as needed (to lower legs for dry skin).   Yes [provider]  traZODone (DESYREL) 50 MG tablet Take 50 mg by mouth at bedtime.   Yes [provider]  Vitamin D, Ergocalciferol, (DRISDOL) 50000 units CAPS capsule Take 50,000 Units by mouth 2 (two) times a week. Mondays and Thursdays in the morning   Yes [provider]  zinc oxide 20 % ointment Apply 1 application topically 3 (three) times daily as needed for irritation (applied to bilateral buttocks).   Yes [provider]  Acetaminophen (TYLENOL ARTHRITIS EXT RELIEF PO) Take 650 mg by mouth daily as needed (for mild pain).    [provider]  albuterol (PROVENTIL HFA;VENTOLIN HFA) 108 (90 Base) MCG/ACT inhaler Inhale 2 puffs into the lungs every 6 (six) hours as needed for wheezing or shortness of breath.    [provider]  amoxicillin (AMOXIL) 500 MG capsule Take 500 mg by mouth  3 (three) times daily. 10 day course starting on 05/20/17    [provider]  azithromycin (ZITHROMAX) 250 MG tablet Take 250 mg by mouth 2 (two) times daily. Take 2 tablets po today then 1 tablet daily for 4 days    [provider]  meclizine (ANTIVERT) 25 MG tablet Take 25 mg by mouth every 6 (six) hours as needed for dizziness.    [provider]  sulfamethoxazole-trimethoprim (BACTRIM DS,SEPTRA DS) 800-160 MG tablet TAKE 1 TABLET BY MOUTH TWICE A DAY FOR 7 DAYS 11/21/17   [provider]    Family History No family history on file.  Social History Social History   Tobacco Use  . Smoking status: Unknown If Ever Smoked  . Smokeless tobacco: Never Used    Substance Use Topics  . Alcohol use: No    Frequency: Never  . Drug use: Never     Allergies   Patient has no known allergies.   Review of Systems Review of Systems  All other systems reviewed and are negative.    Physical Exam Updated Vital Signs BP 106/66   Pulse 78   Temp 98.5 F (36.9 C) (Oral)   Resp 14   SpO2 98%   Physical Exam  Constitutional: No distress.  HENT:  Head: Normocephalic and atraumatic.  Right Ear: External ear normal.  Left Ear: External ear normal.  Eyes: Conjunctivae are normal. Right eye exhibits no discharge. Left eye exhibits no discharge. No scleral icterus.  Neck: Normal range of motion. Neck supple. No tracheal deviation present.  Old range of motion of the neck without pain or discomfort  Cardiovascular: Normal rate, regular rhythm and intact distal pulses.  Pulmonary/Chest: Effort normal and breath sounds normal. No stridor. No respiratory distress. She has no wheezes. She has no rales.  Abdominal: Soft. Bowel sounds are normal. She exhibits no distension. There is no tenderness. There is no rebound and no guarding.  Musculoskeletal: She exhibits no edema or deformity.       Right shoulder: She exhibits no tenderness, no bony tenderness and no swelling.       Left shoulder: She exhibits no tenderness, no bony tenderness and no swelling.       Right wrist: She exhibits no tenderness, no bony tenderness and no swelling.       Left wrist: She exhibits no tenderness, no bony tenderness and no swelling.       Right hip: She exhibits normal range of motion, no tenderness, no bony tenderness and no swelling.       Left hip: She exhibits normal range of motion, no tenderness and no bony tenderness.       Right knee: No tenderness found.       Left knee: No tenderness found.       Right ankle: She exhibits no swelling. No tenderness.       Left ankle: She exhibits no swelling. No tenderness.       Cervical back: She exhibits no tenderness, no  bony tenderness and no swelling.       Thoracic back: She exhibits no tenderness, no bony tenderness and no swelling.       Lumbar back: She exhibits no tenderness, no bony tenderness and no swelling.  No pain with range of motion of the hips or knees bilaterally, no gross deformity noted on my exam, no edema or ecchymoses; patient does have a small ulcerative wound on the left heel that is tender to palpation  Neurological: She is alert. She has normal strength. No cranial nerve deficit (no facial droop, extraocular movements intact, no slurred speech) or sensory deficit. She exhibits normal muscle tone. She displays no seizure activity. Coordination normal. GCS eye subscore is 4. GCS verbal subscore is 4. GCS motor subscore is 6.  She has equal grip strength bilaterally, she has no facial droop noted, she is able to plantar flex but patient is able to sit up in bed with some assistance, normal sensation, she does follow commands  Skin: Skin is warm and dry. No rash noted.  Psychiatric: She has a normal mood and affect.  Nursing note and vitals reviewed.    ED Treatments / Results  Labs (all labs ordered are listed, but only abnormal results are displayed) Labs Reviewed  BASIC METABOLIC PANEL - Abnormal; Notable for the following components:      Result Value   Glucose, Bld 113 (*)    BUN 27 (*)    Calcium 8.8 (*)    GFR calc non Af Amer 50 (*)    GFR calc Af Amer 58 (*)    All other components within normal limits  CBC WITH DIFFERENTIAL/PLATELET - Abnormal; Notable for the following components:   RBC 3.81 (*)    Hemoglobin 9.5 (*)    HCT 32.3 (*)    MCH 24.9 (*)    MCHC 29.4 (*)    RDW 18.6 (*)    All other components within normal limits  VALPROIC ACID LEVEL - Abnormal; Notable for the following components:   Valproic Acid Lvl 26 (*)    All other components within normal limits  TROPONIN I    EKG EKG Interpretation  Date/Time:  Wednesday January 21 2018 18:14:51  EDT Ventricular Rate:  81 PR Interval:    QRS Duration: 92 QT Interval:  384 QTC Calculation: 446 R Axis:   7 Text Interpretation:  Sinus rhythm LVH by voltage No old tracing to compare Confirmed by Dorie Rank [phone]) on 01/21/2018 6:26:16 PM   Radiology Dg Chest 1 View  Result Date: 01/21/2018 CLINICAL DATA:  84 year old female found down. EXAM: CHEST  1 VIEW COMPARISON:  Chest radiographs 05/22/2017. FINDINGS: AP supine view at 1827 hours. Stable cardiomegaly and mediastinal contours. Calcified aortic atherosclerosis. Visualized tracheal air column is within normal limits. Stable lung volumes. Allowing for portable technique the lungs are clear. Severe degenerative changes at both shoulders. No acute osseous abnormality identified. IMPRESSION: 1. No acute cardiopulmonary abnormality or acute traumatic injury identified. 2.  Aortic Atherosclerosis (ICD10-I70.0). Electronically Signed   By: Genevie Ann M.D.   On: 01/21/2018 19:52   Ct Head Wo Contrast  Result Date: 01/21/2018 CLINICAL DATA:  84 year old female found down. EXAM: CT HEAD WITHOUT CONTRAST TECHNIQUE: Contiguous axial images were obtained from the base of the skull through the vertex without intravenous contrast. COMPARISON:  None. FINDINGS: Brain: Patchy bilateral cerebral white matter hypodensity. Generalized cerebral volume loss. No midline shift, ventriculomegaly, mass effect, evidence of mass lesion, intracranial hemorrhage or evidence of cortically based acute infarction. No cortical encephalomalacia identified. Vascular: Calcified atherosclerosis at the skull base. No suspicious intracranial vascular hyperdensity. Skull: Severe degenerative changes at the left TMJ and visible upper cervical spine. No skull fracture identified. Sinuses/Orbits: Visualized paranasal sinuses and mastoids are well pneumatized. Other: No acute orbit or scalp soft tissue findings. IMPRESSION: 1.  No acute intracranial abnormality. 2. Generalized cerebral  volume loss. Mild to moderate for age white matter changes which are most commonly due  to small vessel disease. Electronically Signed   By: Genevie Ann M.D.   On: 01/21/2018 19:54   Dg Knee Complete 4 Views Left  Result Date: 01/21/2018 CLINICAL DATA:  84 year old female found down. EXAM: LEFT KNEE - COMPLETE 4+ VIEW COMPARISON:  None. FINDINGS: Complete medial compartment joint space loss with bone on bone appearance. Subchondral sclerosis and sclerosis scratched at subchondral sclerosis and osteophytosis. Moderate to severe lateral compartment joint space loss with spurring. Relatively preserved medial compartment. Patella appears intact. No acute osseous abnormality identified. Calcified peripheral vascular disease. IMPRESSION: 1.  No acute fracture or dislocation identified about the left knee. 2. Severe medial compartment degeneration. Calcified peripheral vascular disease. Electronically Signed   By: Genevie Ann M.D.   On: 01/21/2018 19:51   Dg Foot Complete Left  Result Date: 01/21/2018 CLINICAL DATA:  84 year old female found down. EXAM: LEFT FOOT - COMPLETE 3+ VIEW COMPARISON:  None. FINDINGS: Osteopenia. Joint spaces and alignment appear normal for age. No fracture or dislocation identified. There is generalized soft tissue swelling in the distal foot. Calcified peripheral vascular disease at the ankle. No soft tissue gas. IMPRESSION: Soft tissue swelling with no No acute fracture or dislocation identified about the left foot. Electronically Signed   By: Genevie Ann M.D.   On: 01/21/2018 19:47   Dg Hips Bilat W Or Wo Pelvis 2 Views  Result Date: 01/21/2018 CLINICAL DATA:  84 year old female found down. EXAM: DG HIP (WITH OR WITHOUT PELVIS) 2V BILAT COMPARISON:  None. FINDINGS: Supine AP and cross-table lateral views. There is bulky callus formation along the medial aspect of both femurs which appears contiguous with the inferior pubic ramus on the left. The femoral heads are normally located. No  proximal femur fracture is identified. There is an age indeterminate fracture of the right inferior pubic ramus. Questionable acute fracture of the left superior pubic ramus near the pubic symphysis on image 4. No other pelvic fracture identified. Aortic and iliofemoral calcified atherosclerosis. Rounded left hemipelvis calcifications likely due to fibroids. Nonobstructed visible bowel gas pattern. IMPRESSION: 1. Age indeterminate fracture of the right inferior pubic ramus, and possible superimposed acute fracture of the left superior pubic ramus. 2. No acute fracture or dislocation of the proximal femurs. Bulky dystrophic calcification along the lesser trochanters. 3.  Aortic Atherosclerosis (ICD10-I70.0).  Fibroid uterus suspected. Electronically Signed   By: Genevie Ann M.D.   On: 01/21/2018 19:50    Procedures Procedures (including critical care time)  Medications Ordered in ED Medications  0.9 %  sodium chloride infusion ( Intravenous New Bag/Given 01/21/18 1932)     Initial Impression / Assessment and Plan / ED Course  I have reviewed the triage vital signs and the nursing notes.  Pertinent labs & imaging results that were available during my care of the patient were reviewed by me and considered in my medical decision making (see chart for details).  Clinical Course as of Jan 22 2156  Wed Jan 21, 2018  2115 Anemia is stable compared to previous values  CBC WITH DIFFERENTIAL(!) [JK]    Clinical Course User Index [JK] Dorie Rank, MD    Patient is a resident of a nursing facility.  She was found next to her bed on the floor.  Patient the emergency room appeared to be at her baseline.  No acute neurologic findings.  Her laboratory tests are stable.  CT scan does not show any evidence of injury.  I doubt stroke based on her findings.  X-rays do  show possible pubic ramus fracture.  Patient states she has not been ambulating recently because of the sore on her heel.  Does appear to have a small  decubitus ulcer without signs of infection.  Patient appears stable to discharge back to the nursing facility.  They can continue with physical therapy treatment for her pelvic fracture.  Final Clinical Impressions(s) / ED Diagnoses   Final diagnoses:  Pubic ramus fracture, left, closed, initial encounter ([place])  Pressure injury of left heel, stage 2 ([place])  Fall, initial encounter      Dorie Rank, MD 01/21/18 2157

## 2018-01-21 NOTE — ED Triage Notes (Signed)
Pt resident of [facility]  Reports staff found pt on the floor beside her bed.  Deformity noted to left knee.  Pt moaning.  EMS administered 22mcg of fentanyl pta.  EMS says staff tried to get out of bed unassisted.  Reports pt has dementia.

## 2018-04-03 ENCOUNTER — Emergency Department (HOSPITAL_COMMUNITY): Payer: Medicare Other

## 2018-04-03 ENCOUNTER — Emergency Department (HOSPITAL_COMMUNITY)
Admission: EM | Admit: 2018-04-03 | Discharge: 2018-04-03 | Disposition: A | Discharge status: Skilled Nursing Facility | Payer: Medicare Other | Attending: Emergency Medicine | Admitting: Emergency Medicine

## 2018-04-03 ENCOUNTER — Encounter (HOSPITAL_COMMUNITY): Payer: Self-pay | Admitting: Emergency Medicine

## 2018-04-03 ENCOUNTER — Other Ambulatory Visit: Payer: Self-pay

## 2018-04-03 DIAGNOSIS — L989 Disorder of the skin and subcutaneous tissue, unspecified: Secondary | ICD-10-CM

## 2018-04-03 LAB — URINALYSIS, ROUTINE W REFLEX MICROSCOPIC
Bilirubin Urine: NEGATIVE
GLUCOSE, UA: NEGATIVE mg/dL
HGB URINE DIPSTICK: NEGATIVE
Ketones, ur: 5 mg/dL — AB
LEUKOCYTES UA: NEGATIVE
Nitrite: NEGATIVE
PROTEIN: NEGATIVE mg/dL
SPECIFIC GRAVITY, URINE: 1.023 (ref 1.005–1.030)
pH: 5 (ref 5.0–8.0)

## 2018-04-03 NOTE — ED Notes (Signed)
Patient transported to X-ray 

## 2018-04-03 NOTE — ED Triage Notes (Signed)
Patient arrived via [facility] from [facility] assisted living center. She has a wound on her left foot with drainage and edema that has been worse the last several days. The patient has an alt mental status at baseline but has become more agitated today.

## 2018-04-03 NOTE — Discharge Instructions (Addendum)
Clean and heal twice a day with peroxide and bandage.  Follow-up with your doctor in a week for recheck

## 2018-04-03 NOTE — ED Provider Notes (Signed)
[facility] EMERGENCY DEPARTMENT Provider Note   CSN: [id number] Arrival date & time: 04/03/18  1659     History   Chief Complaint Chief Complaint  Patient presents with  . Wound Check    HPI Carmen Christian is a 84 y.o. female.  Patient was brought over from the nursing home for questions about her heel.  She is demented.  The history is provided by the nursing home. No language interpreter was used.  Wound Check  This is a new problem. The current episode started more than 2 days ago. The problem occurs constantly. The problem has not changed since onset.Pertinent negatives include no shortness of breath. Nothing aggravates the symptoms.    Past Medical History:  Diagnosis Date  . Anemia   . CHF (congestive heart failure) ([place])   . Dementia ([place])   . Depression   . Dizziness   . High cholesterol   . Hypercholesterolemia   . Multiple myeloma ([place])   . Renal disorder   . Vitamin D deficiency     There are no active problems to display for this patient.   History reviewed. No pertinent surgical history.   OB History   No obstetric history on file.      Home Medications    Prior to Admission medications   Medication Sig Start Date End Date Taking? Authorizing Provider  Acetaminophen (TYLENOL ARTHRITIS EXT RELIEF PO) Take 650 mg by mouth daily as needed (for mild pain).    [provider]  albuterol (PROVENTIL HFA;VENTOLIN HFA) 108 (90 Base) MCG/ACT inhaler Inhale 2 puffs into the lungs every 6 (six) hours as needed for wheezing or shortness of breath.    [provider]  amoxicillin (AMOXIL) 500 MG capsule Take 500 mg by mouth 3 (three) times daily. 10 day course starting on 05/20/17    [provider]  azithromycin (ZITHROMAX) 250 MG tablet Take 250 mg by mouth 2 (two) times daily. Take 2 tablets po today then 1 tablet daily for 4 days    [provider]  diclofenac sodium (VOLTAREN) 1 % GEL Apply 2 g topically 4 (four) times  daily.    [provider]  divalproex (DEPAKOTE) 125 MG DR tablet Take 125 mg by mouth 3 (three) times daily.    [provider]  Fe Fum-FePoly-FA-Vit C-Vit B3 (INTEGRA F) 125-1 MG CAPS Take 1 capsule by mouth daily.    [provider]  furosemide (LASIX) 20 MG tablet Take 20 mg by mouth daily.    [provider]  gabapentin (NEURONTIN) 300 MG capsule Take 300 mg by mouth 3 (three) times daily.    [provider]  meclizine (ANTIVERT) 25 MG tablet Take 25 mg by mouth every 6 (six) hours as needed for dizziness.    [provider]  multivitamin-lutein (OCUVITE-LUTEIN) CAPS capsule Take 1 capsule by mouth daily.    [provider]  sertraline (ZOLOFT) 50 MG tablet Take 50 mg by mouth daily.     [provider]  simvastatin (ZOCOR) 40 MG tablet Take 40 mg by mouth at bedtime.    [provider]  Skin Protectants, Misc. (MINERIN) CREA Apply 1 application topically daily as needed (to lower legs for dry skin).    [provider]  sulfamethoxazole-trimethoprim (BACTRIM DS,SEPTRA DS) 800-160 MG tablet TAKE 1 TABLET BY MOUTH TWICE A DAY FOR 7 DAYS 11/21/17   [provider]  traZODone (DESYREL) 50 MG tablet Take 50 mg by mouth at  bedtime.    [provider]  Vitamin D, Ergocalciferol, (DRISDOL) 50000 units CAPS capsule Take 50,000 Units by mouth 2 (two) times a week. Mondays and Thursdays in the morning    [provider]  zinc oxide 20 % ointment Apply 1 application topically 3 (three) times daily as needed for irritation (applied to bilateral buttocks).    [provider]    Family History No family history on file.  Social History Social History   Tobacco Use  . Smoking status: Unknown If Ever Smoked  . Smokeless tobacco: Never Used  Substance Use Topics  . Alcohol use: No    Frequency: Never  . Drug use: Never     Allergies   Patient has no known  allergies.   Review of Systems Review of Systems  Unable to perform ROS: Dementia  Respiratory: Negative for shortness of breath.      Physical Exam Updated Vital Signs BP 115/77   Pulse 93   Temp 98.4 F (36.9 C) (Oral)   Resp 18   SpO2 95%   Physical Exam Constitutional:      Appearance: She is well-developed.  HENT:     Head: Normocephalic.     Nose: Nose normal.  Eyes:     Conjunctiva/sclera: Conjunctivae normal.  Neck:     Trachea: No tracheal deviation.  Cardiovascular:     Heart sounds: No murmur.  Pulmonary:     Breath sounds: No wheezing.  Abdominal:     Tenderness: There is no abdominal tenderness.  Musculoskeletal: Normal range of motion.     Comments: Small ulcer on heel  Skin:    General: Skin is warm.  Neurological:     Mental Status: She is alert and oriented to person, place, and time.      ED Treatments / Results  Labs (all labs ordered are listed, but only abnormal results are displayed) Labs Reviewed  URINALYSIS, ROUTINE W REFLEX MICROSCOPIC - Abnormal; Notable for the following components:      Result Value   APPearance HAZY (*)    Ketones, ur 5 (*)    All other components within normal limits    EKG None  Radiology Dg Foot Complete Left  Result Date: 04/03/2018 CLINICAL DATA:  wound on her left foot with drainage and edema that has been worse the last several days. The patient has an alt mental status at baseline but has become more agitated today. Pt was held for all images, agitated, fought against each view. EXAM: LEFT FOOT - COMPLETE 3+ VIEW COMPARISON:  It has 01/21/2018 FINDINGS: There is significant soft tissue swelling of the entire foot, particularly involving the forefoot. No radiopaque foreign body or soft tissue gas. Bones are radiolucent. No cortical erosion identified to indicate presence of osteomyelitis. IMPRESSION: Significant soft tissue swelling.  No acute osseous abnormality. Electronically Signed   By: Nolon Nations M.D.   On: 04/03/2018 18:48    Procedures Procedures (including critical care time)  Medications Ordered in ED Medications - No data to display   Initial Impression / Assessment and Plan / ED Course  I have reviewed the triage vital signs and the nursing notes.  Pertinent labs & imaging results that were available during my care of the patient were reviewed by me and considered in my medical decision making (see chart for details).     X-ray of foot unremarkable.  Patient has a small pressure ulcer right heel that does not look infected.  She will get it cleaned couple times a day with peroxide and follow-up with her doctor Final Clinical Impressions(s) / ED Diagnoses   Final diagnoses:  Skin abnormality    ED Discharge Orders    None       Milton Ferguson, MD 04/03/18 [phone]

## 2018-04-03 NOTE — ED Notes (Addendum)
Nonadhesive dressing applied to left foot.

## 2018-04-05 ENCOUNTER — Emergency Department (HOSPITAL_COMMUNITY): Payer: Medicare Other

## 2018-04-05 ENCOUNTER — Other Ambulatory Visit: Payer: Self-pay

## 2018-04-05 ENCOUNTER — Emergency Department (HOSPITAL_COMMUNITY)
Admission: EM | Admit: 2018-04-05 | Discharge: 2018-04-06 | Disposition: A | Discharge status: Home / Self Care | Payer: Medicare Other | Attending: Emergency Medicine | Admitting: Emergency Medicine

## 2018-04-05 ENCOUNTER — Encounter (HOSPITAL_COMMUNITY): Payer: Self-pay | Admitting: *Deleted

## 2018-04-05 DIAGNOSIS — L97429 Non-pressure chronic ulcer of left heel and midfoot with unspecified severity: Secondary | ICD-10-CM | POA: Diagnosis not present

## 2018-04-05 DIAGNOSIS — N3000 Acute cystitis without hematuria: Secondary | ICD-10-CM | POA: Diagnosis not present

## 2018-04-05 DIAGNOSIS — L089 Local infection of the skin and subcutaneous tissue, unspecified: Secondary | ICD-10-CM

## 2018-04-05 DIAGNOSIS — Z79899 Other long term (current) drug therapy: Secondary | ICD-10-CM | POA: Insufficient documentation

## 2018-04-05 DIAGNOSIS — I509 Heart failure, unspecified: Secondary | ICD-10-CM | POA: Insufficient documentation

## 2018-04-05 DIAGNOSIS — R451 Restlessness and agitation: Secondary | ICD-10-CM | POA: Diagnosis present

## 2018-04-05 DIAGNOSIS — T148XXA Other injury of unspecified body region, initial encounter: Secondary | ICD-10-CM

## 2018-04-05 DIAGNOSIS — F0390 Unspecified dementia without behavioral disturbance: Secondary | ICD-10-CM | POA: Insufficient documentation

## 2018-04-05 NOTE — ED Provider Notes (Signed)
[facility] EMERGENCY DEPARTMENT Provider Note   CSN: [id number] Arrival date & time: 04/05/18  2212     History   Chief Complaint Chief Complaint  Patient presents with  . Agitation    HPI Carmen Christian is a 84 y.o. female presenting from a local nursing facility with reports that she has had increased agitation this evening.  At baseline patient has dementia and is bedbound, other medical history includes CHF, multiple myeloma and renal insufficiency.  She was seen here several days ago and was diagnosed with a small pressure ulcer on her left posterior heel which is been treated with dressing changes daily.  She has had no known fevers, no vomiting, no other recent illnesses.  Patient is unable to contribute to her history or reason for her increased agitation given her advanced dementia.  She was refusing to take her medications this evening and was transported here.  The history is provided by the nursing home and medical records. The history is limited by the condition of the patient.    Past Medical History:  Diagnosis Date  . Anemia   . CHF (congestive heart failure) ([place])   . Dementia ([place])   . Depression   . Dizziness   . High cholesterol   . Hypercholesterolemia   . Multiple myeloma ([place])   . Renal disorder   . Vitamin D deficiency     There are no active problems to display for this patient.   History reviewed. No pertinent surgical history.   OB History   No obstetric history on file.      Home Medications    Prior to Admission medications   Medication Sig Start Date End Date Taking? Authorizing Provider  Acetaminophen (TYLENOL ARTHRITIS EXT RELIEF PO) Take 650 mg by mouth daily as needed (for mild pain).    [provider]  albuterol (PROVENTIL HFA;VENTOLIN HFA) 108 (90 Base) MCG/ACT inhaler Inhale 2 puffs into the lungs every 6 (six) hours as needed for wheezing or shortness of breath.    [provider]  amoxicillin (AMOXIL) 500  MG capsule Take 500 mg by mouth 3 (three) times daily. 10 day course starting on 05/20/17    [provider]  azithromycin (ZITHROMAX) 250 MG tablet Take 250 mg by mouth 2 (two) times daily. Take 2 tablets po today then 1 tablet daily for 4 days    [provider]  diclofenac sodium (VOLTAREN) 1 % GEL Apply 2 g topically 4 (four) times daily.    [provider]  divalproex (DEPAKOTE) 125 MG DR tablet Take 125 mg by mouth 3 (three) times daily.    [provider]  Fe Fum-FePoly-FA-Vit C-Vit B3 (INTEGRA F) 125-1 MG CAPS Take 1 capsule by mouth daily.    [provider]  furosemide (LASIX) 20 MG tablet Take 20 mg by mouth daily.    [provider]  gabapentin (NEURONTIN) 300 MG capsule Take 300 mg by mouth 3 (three) times daily.    [provider]  meclizine (ANTIVERT) 25 MG tablet Take 25 mg by mouth every 6 (six) hours as needed for dizziness.    [provider]  multivitamin-lutein (OCUVITE-LUTEIN) CAPS capsule Take 1 capsule by mouth daily.    [provider]  sertraline (ZOLOFT) 50 MG tablet Take 50 mg by mouth daily.     [provider]  simvastatin (ZOCOR) 40 MG tablet Take 40 mg by mouth at bedtime.    [provider]  Skin  Protectants, Misc. (MINERIN) CREA Apply 1 application topically daily as needed (to lower legs for dry skin).    [provider]  sulfamethoxazole-trimethoprim (BACTRIM DS,SEPTRA DS) 800-160 MG tablet Take 1 tablet by mouth 2 (two) times daily for 10 days. 04/06/18 04/16/18  Evalee Jefferson, PA-C  traZODone (DESYREL) 50 MG tablet Take 50 mg by mouth at bedtime.    [provider]  Vitamin D, Ergocalciferol, (DRISDOL) 50000 units CAPS capsule Take 50,000 Units by mouth 2 (two) times a week. Mondays and Thursdays in the morning    [provider]  zinc oxide 20 % ointment Apply 1 application topically 3 (three) times daily as needed for irritation (applied to  bilateral buttocks).    [provider]    Family History History reviewed. No pertinent family history.  Social History Social History   Tobacco Use  . Smoking status: Unknown If Ever Smoked  . Smokeless tobacco: Never Used  Substance Use Topics  . Alcohol use: No    Frequency: Never  . Drug use: Never     Allergies   Patient has no known allergies.   Review of Systems Review of Systems  Unable to perform ROS: Dementia     Physical Exam Updated Vital Signs BP 123/75   Pulse (!) 112   Temp 98.4 F (36.9 C) (Oral)   Resp 16   SpO2 93%   Physical Exam Vitals signs and nursing note reviewed.  Constitutional:      Appearance: She is well-developed.  HENT:     Head: Normocephalic and atraumatic.     Right Ear: Tympanic membrane normal.     Left Ear: Tympanic membrane normal.     Nose: No congestion.  Eyes:     Conjunctiva/sclera: Conjunctivae normal.  Neck:     Musculoskeletal: Normal range of motion.  Cardiovascular:     Rate and Rhythm: Normal rate and regular rhythm.     Heart sounds: Normal heart sounds.  Pulmonary:     Effort: Pulmonary effort is normal.     Breath sounds: Normal breath sounds. No wheezing.  Abdominal:     General: Bowel sounds are normal. There is no distension.     Palpations: Abdomen is soft. There is no mass.     Tenderness: There is no abdominal tenderness. There is no guarding.  Musculoskeletal:        General: Swelling present.     Left foot: Swelling present.     Comments: Patient with moderate edema and increased warmth of the left foot and ankle.  There is a 2 cm ulceration on her left posterior heel.  There is trace of purulent discharge on the dressing.    Skin:    General: Skin is warm and dry.  Neurological:     Mental Status: She is alert. She is confused.  Psychiatric:        Behavior: Behavior is agitated.      ED Treatments / Results  Labs (all labs ordered are listed, but only abnormal results  are displayed) Labs Reviewed  URINALYSIS, ROUTINE W REFLEX MICROSCOPIC - Abnormal; Notable for the following components:      Result Value   Color, Urine AMBER (*)    APPearance TURBID (*)    Hgb urine dipstick MODERATE (*)    Ketones, ur 20 (*)    Protein, ur 30 (*)    Nitrite POSITIVE (*)    Leukocytes, UA LARGE (*)    WBC, UA >50 (*)  Bacteria, UA RARE (*)    Non Squamous Epithelial 6-10 (*)    All other components within normal limits  I-STAT CHEM 8, ED - Abnormal; Notable for the following components:   Sodium 146 (*)    Calcium, Ion 1.10 (*)    All other components within normal limits  URINE CULTURE    EKG None  Radiology Dg Foot Complete Left  Result Date: 04/06/2018 CLINICAL DATA:  Purulence heel ulcer and worsening swelling EXAM: LEFT FOOT - COMPLETE 3+ VIEW COMPARISON:  04/03/2018 left foot radiographs FINDINGS: Moderate diffuse soft tissue swelling. Soft tissue ulcer in the left heel overlying the posterior left calcaneus. No fracture or dislocation. No acute osseous erosions. No suspicious focal osseous lesions. No periosteal reaction or radiopaque foreign bodies. IMPRESSION: Soft tissue ulcer in the left heel overlying the posterior left calcaneus. Moderate diffuse soft tissue swelling. No specific radiographic findings of acute osteomyelitis. Electronically Signed   By: Ilona Sorrel M.D.   On: 04/06/2018 00:20    Procedures Procedures (including critical care time)  Medications Ordered in ED Medications  sulfamethoxazole-trimethoprim (BACTRIM,SEPTRA) 200-40 MG/5ML suspension 20 mL (has no administration in time range)  LORazepam (ATIVAN) injection 0.5 mg (0.5 mg Intramuscular Given 04/06/18 0110)     Initial Impression / Assessment and Plan / ED Course  I have reviewed the triage vital signs and the nursing notes.  Pertinent labs & imaging results that were available during my care of the patient were reviewed by me and considered in my medical decision  making (see chart for details).     Call from [place] who is a provider for this patient at her nursing center with further information.  She states that this evening in addition to being more agitated, the patient was unwilling to swallow, not just her medications, but she tried to give her drops of water using a straw and she refused to swallow that as well.  Alyse Low states she is usually very good at taking her medications.  RN here attempted to feed pt pudding which she was able to swallow, although was not really happy and became more agitated while eating it.  There is no mechanical dysphagia, appears more agitation related.  MAR has lorazapam 0.5 mg PO prn agitation listed. Was given an IM dose 0.5 here and started her on abx for uti and wound infection.    Marita Kansas also informs that this patient has been going to physical therapy for the wound care on her left heel for several months, but it has recently become larger and has developed an odor, hence the reason she was sent here several days ago.  On this evening's exam the patient's wound has an infected smell, trace bloody appearing drainage on bandage.  Imaging tonight is without any evidence for osteomyelitis, however she will be placed on antibiotics and plan for continued wound care and close f/u with pcp.  Final Clinical Impressions(s) / ED Diagnoses   Final diagnoses:  Acute cystitis without hematuria  Wound infection    ED Discharge Orders         Ordered    sulfamethoxazole-trimethoprim (BACTRIM DS,SEPTRA DS) 800-160 MG tablet  2 times daily     04/06/18 0148           Evalee Jefferson, PA-C 04/06/18 0159    Orpah Greek, MD 04/06/18 [phone]

## 2018-04-05 NOTE — ED Triage Notes (Signed)
Pt brought in by [facility] for c/o pt not swallowing pills tonight; facility states pt is more agitated than usual, but this is pt's normal demeanor; staff reported pt kept spitting out pills when trying to be given

## 2018-04-06 DIAGNOSIS — N3000 Acute cystitis without hematuria: Secondary | ICD-10-CM | POA: Diagnosis not present

## 2018-04-06 LAB — URINALYSIS, ROUTINE W REFLEX MICROSCOPIC
Bilirubin Urine: NEGATIVE
Glucose, UA: NEGATIVE mg/dL
Ketones, ur: 20 mg/dL — AB
Nitrite: POSITIVE — AB
PROTEIN: 30 mg/dL — AB
Specific Gravity, Urine: 1.015 (ref 1.005–1.030)
WBC, UA: >50 WBC/hpf — ABNORMAL HIGH (ref 0–5)
pH: 5 (ref 5.0–8.0)

## 2018-04-06 LAB — I-STAT CHEM 8, ED
BUN: 19 mg/dL (ref 8–23)
CHLORIDE: 106 mmol/L (ref 98–111)
Calcium, Ion: 1.1 mmol/L — ABNORMAL LOW (ref 1.15–1.40)
Creatinine, Ser: 0.8 mg/dL (ref 0.44–1.00)
Glucose, Bld: 80 mg/dL (ref 70–99)
HEMATOCRIT: 36 % (ref 36.0–46.0)
HEMOGLOBIN: 12.2 g/dL (ref 12.0–15.0)
POTASSIUM: 3.7 mmol/L (ref 3.5–5.1)
SODIUM: 146 mmol/L — AB (ref 135–145)
TCO2: 31 mmol/L (ref 22–32)

## 2018-04-06 MED ORDER — SULFAMETHOXAZOLE-TRIMETHOPRIM 800-160 MG PO TABS: 1.0000 | ORAL_TABLET | Freq: Two times a day (BID) | ORAL | 0 refills | Status: AC

## 2018-04-06 MED ORDER — SULFAMETHOXAZOLE-TRIMETHOPRIM 800-160 MG PO TABS: 1.0000 | ORAL_TABLET | Freq: Once | ORAL | Status: DC

## 2018-04-06 MED ORDER — LORAZEPAM 2 MG/ML IJ SOLN
0.5000 mg | Freq: Once | INTRAMUSCULAR | Status: AC
  Administered 2018-04-06: 0.5 mg via INTRAMUSCULAR
  Filled 2018-04-06: qty 1

## 2018-04-06 MED ORDER — SULFAMETHOXAZOLE-TRIMETHOPRIM 200-40 MG/5ML PO SUSP
20.0000 mL | Freq: Once | ORAL | Status: AC
  Administered 2018-04-06: 20 mL via ORAL
  Filled 2018-04-06: qty 20

## 2018-04-06 NOTE — Discharge Instructions (Addendum)
Carmen Christian has a urinary tract infection.  Her foot ulcer also does appear to be somewhat infected, although the xray does not suggest a deep structure or bone infection.  The antibiotic prescribed should treat both infections.  Plan a recheck with her primary doctor for any new or worsened symptoms.  She does have the ability to swallow - I know this was a concern tonight, but she did swallow some pudding for [place].  I suspect her agitation has caused her to simply not want to take her medicines tonight.  She should see her primary doctor if this behavior persists as it will be important for her to get her medicines.  You may consider giving the lorazepam which is on her medicine list which can help with agitation.

## 2018-04-09 LAB — URINE CULTURE
Culture: >=100000 — AB
SPECIAL REQUESTS: NORMAL

## 2018-04-10 ENCOUNTER — Telehealth: Payer: Self-pay | Admitting: Emergency Medicine

## 2018-04-10 NOTE — Telephone Encounter (Signed)
Post ED Visit - Positive Culture Follow-up  Culture report reviewed by antimicrobial stewardship pharmacist:  []  Elenor Quinones, Pharm.D. []  Heide Guile, Pharm.D., BCPS AQ-ID []  Parks Neptune, Pharm.D., BCPS []  Alycia Rossetti, Pharm.D., BCPS []  [place], Pharm.D., BCPS, AAHIVP []  Legrand Como, Pharm.D., BCPS, AAHIVP []  Salome Arnt, PharmD, BCPS []  Johnnette Gourd, PharmD, BCPS []  Hughes Better, PharmD, BCPS []  Leeroy Cha, PharmD [place] Guardian PharmD  Positive urine culture Treated with cephalexin, organism sensitive to the same and no further patient follow-up is required at this time.  Hazle Nordmann 04/10/2018, 9:20 AM

## 2018-04-10 NOTE — Telephone Encounter (Signed)
Post ED Visit - Positive Culture Follow-up  Culture report reviewed by antimicrobial stewardship pharmacist:  []  Elenor Quinones, Pharm.D. []  Heide Guile, Pharm.D., BCPS AQ-ID []  Parks Neptune, Pharm.D., BCPS []  Alycia Rossetti, Pharm.D., BCPS []  [place], [place].D., BCPS, AAHIVP []  Legrand Como, Pharm.D., BCPS, AAHIVP []  Salome Arnt, PharmD, BCPS []  Johnnette Gourd, PharmD, BCPS []  Hughes Better, PharmD, BCPS []  Leeroy Cha, PharmD Harrietta Guardian PharmD  Positive urine culture Treated with sulfamethoxazole-trimethoprim, organism sensitive to the same and no further patient follow-up is required at this time.  Hazle Nordmann 04/10/2018, 9:41 AM

## 2018-04-19 ENCOUNTER — Encounter (HOSPITAL_COMMUNITY): Payer: Self-pay | Admitting: Emergency Medicine

## 2018-04-19 ENCOUNTER — Other Ambulatory Visit: Payer: Self-pay

## 2018-04-19 ENCOUNTER — Emergency Department (HOSPITAL_COMMUNITY): Payer: Medicare Other

## 2018-04-19 ENCOUNTER — Inpatient Hospital Stay (HOSPITAL_COMMUNITY)
Admission: EM | Admit: 2018-04-19 | Discharge: 2018-04-23 | DRG: 690 | Disposition: A | Discharge status: Hospice | Payer: Medicare Other | Attending: Internal Medicine | Admitting: Internal Medicine

## 2018-04-19 DIAGNOSIS — E876 Hypokalemia: Secondary | ICD-10-CM | POA: Diagnosis not present

## 2018-04-19 DIAGNOSIS — I509 Heart failure, unspecified: Secondary | ICD-10-CM | POA: Diagnosis present

## 2018-04-19 DIAGNOSIS — N390 Urinary tract infection, site not specified: Secondary | ICD-10-CM

## 2018-04-19 DIAGNOSIS — Z79899 Other long term (current) drug therapy: Secondary | ICD-10-CM

## 2018-04-19 DIAGNOSIS — Z1612 Extended spectrum beta lactamase (ESBL) resistance: Secondary | ICD-10-CM

## 2018-04-19 DIAGNOSIS — Z66 Do not resuscitate: Secondary | ICD-10-CM | POA: Diagnosis present

## 2018-04-19 DIAGNOSIS — E860 Dehydration: Secondary | ICD-10-CM | POA: Diagnosis present

## 2018-04-19 DIAGNOSIS — F0281 Dementia in other diseases classified elsewhere with behavioral disturbance: Secondary | ICD-10-CM | POA: Diagnosis not present

## 2018-04-19 DIAGNOSIS — Z515 Encounter for palliative care: Secondary | ICD-10-CM

## 2018-04-19 DIAGNOSIS — D649 Anemia, unspecified: Secondary | ICD-10-CM

## 2018-04-19 DIAGNOSIS — G8220 Paraplegia, unspecified: Secondary | ICD-10-CM | POA: Diagnosis present

## 2018-04-19 DIAGNOSIS — F0390 Unspecified dementia without behavioral disturbance: Secondary | ICD-10-CM | POA: Diagnosis present

## 2018-04-19 DIAGNOSIS — L97429 Non-pressure chronic ulcer of left heel and midfoot with unspecified severity: Secondary | ICD-10-CM | POA: Diagnosis present

## 2018-04-19 DIAGNOSIS — D72829 Elevated white blood cell count, unspecified: Secondary | ICD-10-CM | POA: Diagnosis not present

## 2018-04-19 DIAGNOSIS — Z7189 Other specified counseling: Secondary | ICD-10-CM | POA: Diagnosis not present

## 2018-04-19 DIAGNOSIS — Z1624 Resistance to multiple antibiotics: Secondary | ICD-10-CM | POA: Diagnosis present

## 2018-04-19 DIAGNOSIS — E559 Vitamin D deficiency, unspecified: Secondary | ICD-10-CM | POA: Diagnosis present

## 2018-04-19 DIAGNOSIS — I959 Hypotension, unspecified: Secondary | ICD-10-CM

## 2018-04-19 DIAGNOSIS — B9629 Other Escherichia coli [E. coli] as the cause of diseases classified elsewhere: Secondary | ICD-10-CM | POA: Diagnosis not present

## 2018-04-19 DIAGNOSIS — E162 Hypoglycemia, unspecified: Secondary | ICD-10-CM | POA: Diagnosis present

## 2018-04-19 DIAGNOSIS — L89622 Pressure ulcer of left heel, stage 2: Secondary | ICD-10-CM | POA: Diagnosis present

## 2018-04-19 DIAGNOSIS — N3000 Acute cystitis without hematuria: Secondary | ICD-10-CM | POA: Diagnosis present

## 2018-04-19 DIAGNOSIS — B9620 Unspecified Escherichia coli [E. coli] as the cause of diseases classified elsewhere: Secondary | ICD-10-CM | POA: Diagnosis present

## 2018-04-19 DIAGNOSIS — R531 Weakness: Secondary | ICD-10-CM | POA: Diagnosis not present

## 2018-04-19 DIAGNOSIS — E7800 Pure hypercholesterolemia, unspecified: Secondary | ICD-10-CM | POA: Diagnosis present

## 2018-04-19 DIAGNOSIS — Z9181 History of falling: Secondary | ICD-10-CM

## 2018-04-19 DIAGNOSIS — C9000 Multiple myeloma not having achieved remission: Secondary | ICD-10-CM | POA: Diagnosis present

## 2018-04-19 DIAGNOSIS — L97421 Non-pressure chronic ulcer of left heel and midfoot limited to breakdown of skin: Secondary | ICD-10-CM

## 2018-04-19 DIAGNOSIS — R296 Repeated falls: Secondary | ICD-10-CM | POA: Diagnosis present

## 2018-04-19 DIAGNOSIS — G309 Alzheimer's disease, unspecified: Secondary | ICD-10-CM | POA: Diagnosis not present

## 2018-04-19 LAB — HEPATIC FUNCTION PANEL
ALK PHOS: 68 U/L (ref 38–126)
ALT: 19 U/L (ref 0–44)
AST: 22 U/L (ref 15–41)
Albumin: 2.8 g/dL — ABNORMAL LOW (ref 3.5–5.0)
Bilirubin, Direct: <0.1 mg/dL (ref 0.0–0.2)
TOTAL PROTEIN: 6.3 g/dL — AB (ref 6.5–8.1)
Total Bilirubin: 0.4 mg/dL (ref 0.3–1.2)

## 2018-04-19 LAB — BASIC METABOLIC PANEL
Anion gap: 7 (ref 5–15)
BUN: 19 mg/dL (ref 8–23)
CALCIUM: 8.9 mg/dL (ref 8.9–10.3)
CHLORIDE: 106 mmol/L (ref 98–111)
CO2: 28 mmol/L (ref 22–32)
CREATININE: 0.97 mg/dL (ref 0.44–1.00)
GFR calc Af Amer: >60 mL/min (ref >60)
GFR calc non Af Amer: 53 mL/min — ABNORMAL LOW (ref >60)
Glucose, Bld: 107 mg/dL — ABNORMAL HIGH (ref 70–99)
Potassium: 4.2 mmol/L (ref 3.5–5.1)
Sodium: 141 mmol/L (ref 135–145)

## 2018-04-19 LAB — CBC WITH DIFFERENTIAL/PLATELET
Abs Immature Granulocytes: 0.05 10*3/uL (ref 0.00–0.07)
BASOS PCT: 0 %
Basophils Absolute: 0 10*3/uL (ref 0.0–0.1)
EOS ABS: 0.2 10*3/uL (ref 0.0–0.5)
EOS PCT: 2 %
HCT: 34.1 % — ABNORMAL LOW (ref 36.0–46.0)
HEMOGLOBIN: 10.4 g/dL — AB (ref 12.0–15.0)
Immature Granulocytes: 1 %
LYMPHS PCT: 32 %
Lymphs Abs: 3.5 10*3/uL (ref 0.7–4.0)
MCH: 25.1 pg — AB (ref 26.0–34.0)
MCHC: 30.5 g/dL (ref 30.0–36.0)
MCV: 82.4 fL (ref 80.0–100.0)
MONO ABS: 0.5 10*3/uL (ref 0.1–1.0)
Monocytes Relative: 5 %
NRBC: 0 % (ref 0.0–0.2)
Neutro Abs: 6.7 10*3/uL (ref 1.7–7.7)
Neutrophils Relative %: 60 %
Platelets: 218 10*3/uL (ref 150–400)
RBC: 4.14 MIL/uL (ref 3.87–5.11)
RDW: 17.4 % — AB (ref 11.5–15.5)
WBC: 10.9 10*3/uL — AB (ref 4.0–10.5)

## 2018-04-19 LAB — VALPROIC ACID LEVEL: Valproic Acid Lvl: <10 ug/mL — ABNORMAL LOW (ref 50.0–100.0)

## 2018-04-19 LAB — URINALYSIS, ROUTINE W REFLEX MICROSCOPIC
BILIRUBIN URINE: NEGATIVE
GLUCOSE, UA: NEGATIVE mg/dL
HGB URINE DIPSTICK: NEGATIVE
Ketones, ur: 20 mg/dL — AB
NITRITE: POSITIVE — AB
Protein, ur: NEGATIVE mg/dL
Specific Gravity, Urine: 1.015 (ref 1.005–1.030)
WBC, UA: >50 WBC/hpf — ABNORMAL HIGH (ref 0–5)
pH: 6 (ref 5.0–8.0)

## 2018-04-19 LAB — I-STAT TROPONIN, ED: Troponin i, poc: 0.02 ng/mL (ref 0.00–0.08)

## 2018-04-19 LAB — TSH: TSH: 4.78 u[IU]/mL — AB (ref 0.350–4.500)

## 2018-04-19 LAB — VITAMIN B12: Vitamin B-12: 1155 pg/mL — ABNORMAL HIGH (ref 180–914)

## 2018-04-19 LAB — TROPONIN I: Troponin I: <0.03 ng/mL (ref <0.03)

## 2018-04-19 LAB — LACTIC ACID, PLASMA: Lactic Acid, Venous: 0.9 mmol/L (ref 0.5–1.9)

## 2018-04-19 MED ORDER — SODIUM CHLORIDE 0.9 % IV SOLN
1.0000 g | Freq: Two times a day (BID) | INTRAVENOUS | Status: DC
  Administered 2018-04-19 – 2018-04-23 (×8): 1 g via INTRAVENOUS
  Filled 2018-04-19 (×11): qty 1

## 2018-04-19 MED ORDER — TRAZODONE HCL 50 MG PO TABS
50.0000 mg | ORAL_TABLET | Freq: Every day | ORAL | Status: DC
  Administered 2018-04-20 – 2018-04-22 (×3): 50 mg via ORAL
  Filled 2018-04-19 (×3): qty 1

## 2018-04-19 MED ORDER — GABAPENTIN 300 MG PO CAPS
300.0000 mg | ORAL_CAPSULE | Freq: Three times a day (TID) | ORAL | Status: DC
  Administered 2018-04-20 – 2018-04-23 (×9): 300 mg via ORAL
  Filled 2018-04-19 (×10): qty 1

## 2018-04-19 MED ORDER — IPRATROPIUM-ALBUTEROL 0.5-2.5 (3) MG/3ML IN SOLN: 3.0000 mL | RESPIRATORY_TRACT | Status: DC | PRN

## 2018-04-19 MED ORDER — SODIUM CHLORIDE 0.9 % IV SOLN: 1.0000 g | Freq: Two times a day (BID) | INTRAVENOUS | Status: DC

## 2018-04-19 MED ORDER — SERTRALINE HCL 50 MG PO TABS
50.0000 mg | ORAL_TABLET | Freq: Every day | ORAL | Status: DC
  Administered 2018-04-20 – 2018-04-23 (×3): 50 mg via ORAL
  Filled 2018-04-19 (×3): qty 1

## 2018-04-19 MED ORDER — ACETAMINOPHEN 650 MG RE SUPP: 650.0000 mg | Freq: Four times a day (QID) | RECTAL | Status: DC | PRN

## 2018-04-19 MED ORDER — HALOPERIDOL LACTATE 5 MG/ML IJ SOLN: 1.0000 mg | Freq: Four times a day (QID) | INTRAMUSCULAR | Status: DC | PRN

## 2018-04-19 MED ORDER — SODIUM CHLORIDE 0.9 % IV BOLUS
1000.0000 mL | Freq: Once | INTRAVENOUS | Status: AC
  Administered 2018-04-19: 1000 mL via INTRAVENOUS

## 2018-04-19 MED ORDER — ENOXAPARIN SODIUM 40 MG/0.4ML ~~LOC~~ SOLN
40.0000 mg | SUBCUTANEOUS | Status: DC
  Administered 2018-04-19 – 2018-04-23 (×5): 40 mg via SUBCUTANEOUS
  Filled 2018-04-19 (×5): qty 0.4

## 2018-04-19 MED ORDER — SODIUM CHLORIDE 0.9 % IV SOLN
500.0000 mg | Freq: Once | INTRAVENOUS | Status: AC
  Administered 2018-04-19: 500 mg via INTRAVENOUS
  Filled 2018-04-19: qty 500

## 2018-04-19 MED ORDER — ATORVASTATIN CALCIUM 40 MG PO TABS
40.0000 mg | ORAL_TABLET | Freq: Every day | ORAL | Status: DC
  Administered 2018-04-20 – 2018-04-23 (×3): 40 mg via ORAL
  Filled 2018-04-19 (×3): qty 1

## 2018-04-19 MED ORDER — OCUVITE-LUTEIN PO CAPS
1.0000 | ORAL_CAPSULE | Freq: Every day | ORAL | Status: DC
  Administered 2018-04-20 – 2018-04-23 (×3): 1 via ORAL
  Filled 2018-04-19 (×4): qty 1

## 2018-04-19 MED ORDER — ONDANSETRON HCL 4 MG PO TABS: 4.0000 mg | ORAL_TABLET | Freq: Four times a day (QID) | ORAL | Status: DC | PRN

## 2018-04-19 MED ORDER — ONDANSETRON HCL 4 MG/2ML IJ SOLN: 4.0000 mg | Freq: Four times a day (QID) | INTRAMUSCULAR | Status: DC | PRN

## 2018-04-19 MED ORDER — ACETAMINOPHEN 325 MG PO TABS
650.0000 mg | ORAL_TABLET | Freq: Four times a day (QID) | ORAL | Status: DC | PRN
  Administered 2018-04-20 – 2018-04-22 (×2): 650 mg via ORAL
  Filled 2018-04-19 (×2): qty 2

## 2018-04-19 MED ORDER — DIVALPROEX SODIUM 125 MG PO DR TAB
125.0000 mg | DELAYED_RELEASE_TABLET | Freq: Two times a day (BID) | ORAL | Status: DC
  Administered 2018-04-20 – 2018-04-23 (×6): 125 mg via ORAL
  Filled 2018-04-19 (×10): qty 1

## 2018-04-19 MED ORDER — SODIUM CHLORIDE 0.9 % IV BOLUS: 1000.0000 mL | Freq: Once | INTRAVENOUS | Status: DC

## 2018-04-19 MED ORDER — LORAZEPAM 2 MG/ML IJ SOLN: 0.5000 mg | INTRAMUSCULAR | Status: DC | PRN

## 2018-04-19 MED ORDER — SENNOSIDES-DOCUSATE SODIUM 8.6-50 MG PO TABS: 1.0000 | ORAL_TABLET | Freq: Every evening | ORAL | Status: DC | PRN

## 2018-04-19 MED ORDER — SODIUM CHLORIDE 0.9 % IV SOLN
INTRAVENOUS | Status: DC
  Administered 2018-04-19 – 2018-04-20 (×2): via INTRAVENOUS

## 2018-04-19 NOTE — ED Provider Notes (Signed)
[facility] EMERGENCY DEPARTMENT Provider Note   CSN: [id number] Arrival date & time: 04/19/18  9833     History   Chief Complaint Chief Complaint  Patient presents with  . Altered Mental Status    HPI Carmen Christian is a 85 y.o. female.  Patient was sent to the emergency department with more confusion.  Patient has a history of dementia.  She usually speaks to you but now she is not saying any words  The history is provided by the nursing home. No language interpreter was used.  Altered Mental Status  Presenting symptoms: behavior changes   Severity:  Moderate Most recent episode:  Today Episode history:  Continuous Duration:  5 days Timing:  Constant Progression:  Worsening Chronicity:  Recurrent Context: dementia   Context: not alcohol use   Associated symptoms: no abdominal pain     Past Medical History:  Diagnosis Date  . Anemia   . CHF (congestive heart failure) ([place])   . Dementia ([place])   . Depression   . Dizziness   . High cholesterol   . Hypercholesterolemia   . Multiple myeloma ([place])   . Renal disorder   . Vitamin D deficiency     Patient Active Problem List   Diagnosis Date Noted  . UTI due to extended-spectrum beta lactamase (ESBL) producing Escherichia coli 04/19/2018  . Leukocytosis 04/19/2018  . Anemia 04/19/2018  . Dementia ([place]) 04/19/2018  . CHF (congestive heart failure) ([place]) 04/19/2018  . Chronic ulcer of left heel ([place]) 04/19/2018  . Multiple myeloma ([place]) 04/19/2018  . Hypotension 04/19/2018  . Dehydration 04/19/2018    History reviewed. No pertinent surgical history.   OB History   No obstetric history on file.      Home Medications    Prior to Admission medications   Medication Sig Start Date End Date Taking? Authorizing Provider  atorvastatin (LIPITOR) 40 MG tablet Take 40 mg by mouth daily.   Yes [provider]  dexamethasone (DECADRON) 4 MG tablet Take 4 mg by mouth every 14 (fourteen) days.   Yes [provider]  divalproex (DEPAKOTE) 125 MG DR tablet Take 125 mg by mouth 2 (two) times daily.    Yes [provider]  doxycycline (ADOXA) 100 MG tablet Take 100 mg by mouth daily.   Yes [provider]  Fe Fum-FePoly-FA-Vit C-Vit B3 (INTEGRA F) 125-1 MG CAPS Take 1 capsule by mouth daily.   Yes [provider]  furosemide (LASIX) 20 MG tablet Take 20 mg by mouth daily.   Yes [provider]  gabapentin (NEURONTIN) 300 MG capsule Take 300 mg by mouth 3 (three) times daily.   Yes [provider]  LORazepam (ATIVAN) 0.5 MG tablet Take 0.5 mg by mouth every 8 (eight) hours. Take 1/2 tablet daily as need for anxiety   Yes [provider]  multivitamin-lutein (OCUVITE-LUTEIN) CAPS capsule Take 1 capsule by mouth daily.   Yes [provider]  sertraline (ZOLOFT) 50 MG tablet Take 50 mg by mouth daily.    Yes [provider]  Skin Protectants, Misc. (MINERIN) CREA Apply 1 application topically daily as needed (to lower legs for dry skin).   Yes [provider]  traZODone (DESYREL) 50 MG tablet Take 50 mg by mouth at bedtime.   Yes [provider]  zinc oxide 20 % ointment Apply 1 application topically 3 (three) times daily as needed for irritation (applied to bilateral buttocks).   Yes [provider]  Acetaminophen (TYLENOL ARTHRITIS EXT RELIEF PO) Take 650 mg by mouth daily as needed (for mild pain).    [provider]  albuterol (PROVENTIL HFA;VENTOLIN HFA) 108 (90 Base) MCG/ACT inhaler Inhale 2 puffs into the lungs every 6 (six) hours as needed for wheezing or shortness of breath.    [provider]  amoxicillin (AMOXIL) 500 MG capsule Take 500 mg by mouth 3 (three) times daily. 10 day course starting on 05/20/17    [provider]  azithromycin (ZITHROMAX) 250 MG tablet Take 250 mg by mouth 2 (two) times daily. Take 2 tablets po today then 1 tablet daily for 4 days    [provider]  diclofenac sodium (VOLTAREN) 1 % GEL Apply 2 g topically 4 (four) times daily.    [provider]  meclizine (ANTIVERT) 25 MG tablet Take 25 mg by mouth every 6 (six) hours as needed for dizziness.    [provider]  Vitamin D, Ergocalciferol, (DRISDOL) 50000 units CAPS capsule Take 50,000 Units by mouth 2 (two) times a week. Mondays and Thursdays in the morning    [provider]    Family History History reviewed. No pertinent family history.  Social History Social History   Tobacco Use  . Smoking status: Unknown If Ever Smoked  . Smokeless tobacco: Never Used  Substance Use Topics  . Alcohol use: No    Frequency: Never  . Drug use: Never     Allergies   Patient has no known allergies.   Review of Systems Review of Systems  Unable to perform ROS: Dementia  Gastrointestinal: Negative for abdominal pain.     Physical Exam Updated Vital Signs BP (!) 94/50   Pulse 75   Temp 97.9 F (36.6 C) (Oral)   Resp 15   Wt 72.6 kg   SpO2 96%   BMI 25.82 kg/m   Physical Exam Vitals signs reviewed.  Constitutional:      Appearance: She is well-developed.  HENT:     Head: Normocephalic.     Nose: Nose normal.  Eyes:     General: No scleral icterus.    Conjunctiva/sclera: Conjunctivae normal.  Neck:     Musculoskeletal: Neck supple.     Thyroid: No thyromegaly.  Cardiovascular:     Rate and Rhythm: Normal rate and regular rhythm.     Heart sounds: No murmur. No friction rub. No gallop.   Pulmonary:     Breath sounds: No stridor. No wheezing or rales.  Chest:     Chest wall: No tenderness.  Abdominal:     General: There is no distension.     Tenderness: There is no abdominal tenderness. There is no rebound.  Musculoskeletal: Normal range of motion.  Lymphadenopathy:     Cervical: No cervical adenopathy.  Skin:    Findings: No erythema or rash.  Neurological:     Mental Status: She is alert.     Motor: No abnormal  muscle tone.     Coordination: Coordination normal.     Comments: Patient alert but will not respond to painful stimuli      ED Treatments / Results  Labs (all labs ordered are listed, but only abnormal results are displayed) Labs Reviewed  CBC WITH DIFFERENTIAL/PLATELET - Abnormal; Notable for the following components:      Result Value   WBC 10.9 (*)    Hemoglobin 10.4 (*)    HCT 34.1 (*)    MCH 25.1 (*)  RDW 17.4 (*)    All other components within normal limits  BASIC METABOLIC PANEL - Abnormal; Notable for the following components:   Glucose, Bld 107 (*)    GFR calc non Af Amer 53 (*)    All other components within normal limits  HEPATIC FUNCTION PANEL - Abnormal; Notable for the following components:   Total Protein 6.3 (*)    Albumin 2.8 (*)    All other components within normal limits  URINALYSIS, ROUTINE W REFLEX MICROSCOPIC - Abnormal; Notable for the following components:   APPearance HAZY (*)    Ketones, ur 20 (*)    Nitrite POSITIVE (*)    Leukocytes, UA MODERATE (*)    WBC, UA >50 (*)    Bacteria, UA MANY (*)    All other components within normal limits  VALPROIC ACID LEVEL - Abnormal; Notable for the following components:   Valproic Acid Lvl <10 (*)    All other components within normal limits  URINE CULTURE  CULTURE, BLOOD (ROUTINE X 2)  CULTURE, BLOOD (ROUTINE X 2)  LACTIC ACID, PLASMA  TROPONIN I  I-STAT TROPONIN, ED    EKG None  Radiology Dg Chest 1 View  Result Date: 04/19/2018 CLINICAL DATA:  Weakness. EXAM: CHEST  1 VIEW COMPARISON:  Radiograph of January 21, 2018. FINDINGS: Stable cardiomegaly. Atherosclerosis of thoracic aorta is noted. Minimal right basilar subsegmental atelectasis is noted. Left lung is unremarkable. The visualized skeletal structures are unremarkable. IMPRESSION: Minimal right basilar subsegmental atelectasis. Aortic Atherosclerosis (ICD10-I70.0). Electronically Signed   By: Marijo Conception, M.D.   On: 04/19/2018 11:34     Ct Head Wo Contrast  Result Date: 04/19/2018 CLINICAL DATA:  Patient with agitation.  Altered mental status. EXAM: CT HEAD WITHOUT CONTRAST TECHNIQUE: Contiguous axial images were obtained from the base of the skull through the vertex without intravenous contrast. COMPARISON:  Brain CT 01/21/2018 FINDINGS: Brain: Ventricles and sulci are prominent compatible with atrophy. Periventricular and subcortical white matter hypodensities compatible with chronic microvascular ischemic changes. No evidence for acute cortically based infarct, intracranial hemorrhage, mass lesion or mass-effect. Vascular: Unremarkable Skull: Intact. Sinuses/Orbits: Paranasal sinuses are well aerated. Mastoid air cells are unremarkable. Other: None. IMPRESSION: No acute intracranial process. Atrophy and chronic microvascular ischemic changes. Electronically Signed   By: Lovey Newcomer M.D.   On: 04/19/2018 11:41    Procedures Procedures (including critical care time)  Medications Ordered in ED Medications  sodium chloride 0.9 % bolus 1,000 mL (1,000 mLs Intravenous Not Given 04/19/18 1434)  sodium chloride 0.9 % bolus 1,000 mL (1,000 mLs Intravenous New Bag/Given 04/19/18 1004)  sodium chloride 0.9 % bolus 1,000 mL (1,000 mLs Intravenous New Bag/Given 04/19/18 1106)  imipenem-cilastatin (PRIMAXIN) 500 mg in sodium chloride 0.9 % 100 mL IVPB (0 mg Intravenous Stopped 04/19/18 1146)     Initial Impression / Assessment and Plan / ED Course  I have reviewed the triage vital signs and the nursing notes.  Pertinent labs & imaging results that were available during my care of the patient were reviewed by me and considered in my medical decision making (see chart for details). CRITICAL CARE Performed by: Milton Ferguson Total critical care time:35 minutes Critical care time was exclusive of separately billable procedures and treating other patients. Critical care was necessary to treat or prevent imminent or life-threatening  deterioration. Critical care was time spent personally by me on the following activities: development of treatment plan with patient and/or surrogate as well as nursing, discussions with consultants,  evaluation of patient's response to treatment, examination of patient, obtaining history from patient or surrogate, ordering and performing treatments and interventions, ordering and review of laboratory studies, ordering and review of radiographic studies, pulse oximetry and re-evaluation of patient's condition.    She with urinary tract infection and hypotension leading to more confusion.  She is treated with IV antibiotics and fluids and will be admitted to medicine  Final Clinical Impressions(s) / ED Diagnoses   Final diagnoses:  Acute cystitis without hematuria    ED Discharge Orders    None       Milton Ferguson, MD 04/19/18 1446

## 2018-04-19 NOTE — ED Notes (Signed)
Report to Lisa, RN

## 2018-04-19 NOTE — ED Notes (Signed)
Spoke with Carmen Christian, Med Tech at [facility] who states pt is normally alert and can carry on a conversation.  Our notes report otherwise from previous visits.  MD made aware.

## 2018-04-19 NOTE — Progress Notes (Signed)
Pharmacy Antibiotic Note  Carmen Christian is a 85 y.o. female admitted on 04/19/2018 with ESBL infection.  Pharmacy has been consulted for Merrem dosing.  Plan: Merrem 1gm IV q12h F/U Cx and clinical progress Monitor V/S, labs  Height: 5\' 3"  (160 cm) Weight: 160 lb 0.9 oz (72.6 kg) IBW/kg (Calculated) : 52.4  Temp (24hrs), Avg:97.9 F (36.6 C), Min:97.9 F (36.6 C), Max:97.9 F (36.6 C)  Recent Labs  Lab 04/19/18 0947 04/19/18 1030  WBC 10.9*  --   CREATININE 0.97  --   LATICACIDVEN  --  0.9    Estimated Creatinine Clearance: 39 mL/min (by C-G formula based on SCr of 0.97 mg/dL).    No Known Allergies  Antimicrobials this admission: Merrem 1/12 >>  Primaxin x 1 dose given in ED 1/12  Dose adjustments this admission: n/a  Microbiology results: 1/12 BCx: pending 1/12 UCx: pending  Thank you for allowing pharmacy to be a part of this patient's care.  Isac Sarna, BS [place], [place] Clinical Pharmacist Pager [phone] 04/19/2018 3:04 PM

## 2018-04-19 NOTE — ED Notes (Signed)
Awaiting bed ready, rooming

## 2018-04-19 NOTE — ED Notes (Signed)
Lattie Haw,, RN will be receiving RN and will call back

## 2018-04-19 NOTE — ED Triage Notes (Signed)
Pt presents from [facility] for increased agitation, decreased po intake.  Was recently treated with bactrim for same presentation after positive urine with positive urine culture.  Pt is on doxycycline at this time but not sure why.  Unsure onset time.

## 2018-04-19 NOTE — ED Notes (Addendum)
Awaiting room assignment  

## 2018-04-19 NOTE — ED Notes (Signed)
Pt flexes her R arm and occludes her IV fluids When encouraged to straighten, IV flow is good   Dr Earnest Conroy in to reassess

## 2018-04-19 NOTE — ED Notes (Addendum)
Call to floor for report  Pt room has been changed to [id number]

## 2018-04-19 NOTE — H&P (Addendum)
History and Physical  Carmen Christian DHR:[id number] DOB: January 09, 1929 DOA: 04/19/2018  Referring physician: Roderic Palau MD  PCP: Patient, No Pcp Per   Chief Complaint: agitation   Pt coming from: [place]: Records, ED provider  HPI: Carmen Christian is a 85 y.o. female with dementia,  reported paraplegia, chronic left heel ulcer, resident of [facility] was sent to ED today with increased agitation, gait instability with history of frequent falls, decreased oral intake, refusing to take medications, was recently treated for a UTI a couple of weeks ago in this ED with bactrim however urine cultures came back with multi-drug resistant ESBL E coli that noted to be resistant to Bactrim.  Pt is on doxycycline now for treating a left heel ulcer.  Pt apparently normally vocalizes but has been mute for last couple of days.    ED Course: Pt noted to be hypotensive, confused.  Pt remains mute.  Afebrile. Pt noted to have worsened UTI on exam.  Urine cultures pending.  CT brain with no acute findings.  Patient noted to have an elevated white blood cell count of 10.6. Normal lactate.  Pt noted to have history of multiple myeloma and chronic anemia Hg 10.   Review of Systems: UTO due to dementia.  Past Medical History:  Diagnosis Date  . Anemia   . CHF (congestive heart failure) ([place])   . Dementia ([place])   . Depression   . Dizziness   . High cholesterol   . Hypercholesterolemia   . Multiple myeloma ([place])   . Renal disorder   . Vitamin D deficiency    History reviewed. No pertinent surgical history. Social History:  has an unknown smoking status. She has never used smokeless tobacco. She reports that she does not drink alcohol or use drugs.  No Known Allergies  History reviewed. No pertinent family history.  Prior to Admission medications   Medication Sig Start Date End Date Taking? Authorizing Provider  atorvastatin (LIPITOR) 40 MG tablet Take 40 mg by mouth daily.   Yes [provider]  dexamethasone (DECADRON) 4 MG tablet Take 4 mg by mouth every 14 (fourteen) days.   Yes [provider]  divalproex (DEPAKOTE) 125 MG DR tablet Take 125 mg by mouth 2 (two) times daily.    Yes [provider]  doxycycline (ADOXA) 100 MG tablet Take 100 mg by mouth daily.   Yes [provider]  Fe Fum-FePoly-FA-Vit C-Vit B3 (INTEGRA F) 125-1 MG CAPS Take 1 capsule by mouth daily.   Yes [provider]  furosemide (LASIX) 20 MG tablet Take 20 mg by mouth daily.   Yes [provider]  gabapentin (NEURONTIN) 300 MG capsule Take 300 mg by mouth 3 (three) times daily.   Yes [provider]  LORazepam (ATIVAN) 0.5 MG tablet Take 0.5 mg by mouth every 8 (eight) hours. Take 1/2 tablet daily as need for anxiety   Yes [provider]  multivitamin-lutein (OCUVITE-LUTEIN) CAPS capsule Take 1 capsule by mouth daily.   Yes [provider]  sertraline (ZOLOFT) 50 MG tablet Take 50 mg by mouth daily.    Yes [provider]  Skin Protectants, Misc. (MINERIN) CREA Apply 1 application topically daily as needed (to lower legs for dry skin).   Yes [provider]  traZODone (DESYREL) 50 MG tablet Take 50 mg by mouth at bedtime.   Yes [provider]  zinc oxide 20 % ointment Apply 1 application topically 3 (three)  times daily as needed for irritation (applied to bilateral buttocks).   Yes [provider]  Acetaminophen (TYLENOL ARTHRITIS EXT RELIEF PO) Take 650 mg by mouth daily as needed (for mild pain).    [provider]  albuterol (PROVENTIL HFA;VENTOLIN HFA) 108 (90 Base) MCG/ACT inhaler Inhale 2 puffs into the lungs every 6 (six) hours as needed for wheezing or shortness of breath.    [provider]  amoxicillin (AMOXIL) 500 MG capsule Take 500 mg by mouth 3 (three) times daily. 10 day course starting on 05/20/17    [provider]  azithromycin (ZITHROMAX) 250 MG  tablet Take 250 mg by mouth 2 (two) times daily. Take 2 tablets po today then 1 tablet daily for 4 days    [provider]  diclofenac sodium (VOLTAREN) 1 % GEL Apply 2 g topically 4 (four) times daily.    [provider]  meclizine (ANTIVERT) 25 MG tablet Take 25 mg by mouth every 6 (six) hours as needed for dizziness.    [provider]  Vitamin D, Ergocalciferol, (DRISDOL) 50000 units CAPS capsule Take 50,000 Units by mouth 2 (two) times a week. Mondays and Thursdays in the morning    [provider]   Physical Exam: Vitals:   04/19/18 1200 04/19/18 1300 04/19/18 1330 04/19/18 1400  BP: (!) 102/53 (!) 96/56 (!) 91/52 (!) 94/50  Pulse: 78 71 68 75  Resp: '17 12 13 15  ' Temp:      TempSrc:      SpO2: 98% 97% 99% 96%  Weight:         General exam: elderly female with dementia, Pt mute, refuses to vocalize, lying comfortably supine on the gurney in no obvious distress.  Head, eyes and ENT: Nontraumatic and normocephalic. Pupils equally reacting to light and accommodation. Oral mucosa dry.  Neck: Supple. No JVD, carotid bruit or thyromegaly.  Lymphatics: No lymphadenopathy.  Respiratory system: Clear to auscultation. No increased work of breathing.  Cardiovascular system: norrmal S1 and S2 heard.  No JVD, murmurs, gallops, clicks or pedal edema.  Gastrointestinal system: Abdomen is nondistended, soft and nontender. Normal bowel sounds heard. No organomegaly or masses appreciated.  Central nervous system: dementia. Pt not cooperative for exam, no gross findings.  Extremities: Symmetric 5 x 5 power. Peripheral pulses symmetrically felt.   Skin: No rashes or acute findings.  Musculoskeletal system: small left heel ulcer seen stage 2.  Psychiatry: Pleasant and cooperative.  Labs on Admission:  Basic Metabolic Panel: Recent Labs  Lab 04/19/18 0947  NA 141  K 4.2  CL 106  CO2 28  GLUCOSE 107*  BUN 19  CREATININE 0.97  CALCIUM 8.9    Liver Function Tests: Recent Labs  Lab 04/19/18 1030  AST 22  ALT 19  ALKPHOS 68  BILITOT 0.4  PROT 6.3*  ALBUMIN 2.8*   No results for input(s): LIPASE, AMYLASE in the last 168 hours. No results for input(s): AMMONIA in the last 168 hours. CBC: Recent Labs  Lab 04/19/18 0947  WBC 10.9*  NEUTROABS 6.7  HGB 10.4*  HCT 34.1*  MCV 82.4  PLT 218   Cardiac Enzymes: Recent Labs  Lab 04/19/18 1030  TROPONINI <0.03    BNP (last 3 results) No results for input(s): PROBNP in the last 8760 hours. CBG: No results for input(s): GLUCAP in the last 168 hours.  Radiological Exams on Admission: Dg Chest 1 View  Result Date: 04/19/2018 CLINICAL DATA:  Weakness. EXAM: CHEST  1  VIEW COMPARISON:  Radiograph of January 21, 2018. FINDINGS: Stable cardiomegaly. Atherosclerosis of thoracic aorta is noted. Minimal right basilar subsegmental atelectasis is noted. Left lung is unremarkable. The visualized skeletal structures are unremarkable. IMPRESSION: Minimal right basilar subsegmental atelectasis. Aortic Atherosclerosis (ICD10-I70.0). Electronically Signed   By: Marijo Conception, M.D.   On: 04/19/2018 11:34   Ct Head Wo Contrast  Result Date: 04/19/2018 CLINICAL DATA:  Patient with agitation.  Altered mental status. EXAM: CT HEAD WITHOUT CONTRAST TECHNIQUE: Contiguous axial images were obtained from the base of the skull through the vertex without intravenous contrast. COMPARISON:  Brain CT 01/21/2018 FINDINGS: Brain: Ventricles and sulci are prominent compatible with atrophy. Periventricular and subcortical white matter hypodensities compatible with chronic microvascular ischemic changes. No evidence for acute cortically based infarct, intracranial hemorrhage, mass lesion or mass-effect. Vascular: Unremarkable Skull: Intact. Sinuses/Orbits: Paranasal sinuses are well aerated. Mastoid air cells are unremarkable. Other: None. IMPRESSION: No acute intracranial process. Atrophy and chronic  microvascular ischemic changes. Electronically Signed   By: Lovey Newcomer M.D.   On: 04/19/2018 11:41   EKG: Independently reviewed. NSR  Assessment/Plan Active Problems:   UTI due to extended-spectrum beta lactamase (ESBL) producing Escherichia coli   Leukocytosis   Anemia   Dementia (HCC)   CHF (congestive heart failure) (HCC)   Chronic ulcer of left heel (HCC)   Multiple myeloma (HCC)   Hypotension   Dehydration  1. ESBL UTI -multidrug-resistant infection with no response to Bactrim that she was taking.  Discontinue Bactrim and treating with meropenem.  I have requested blood cultures x2 however I think that she is already received a dose of Primaxin in the ED.  Follow urine culture. 2. Leukocytosis-likely secondary to infection noted above, follow CBC. 3. Hypotension-secondary to dehydration, treating with IV fluids.  Monitor in the stepdown unit. 4. Anemia- chronic: Follow CBC closely in the setting of hydration, transfuse as needed.  Reportedly this is chronic associated with her multiple myeloma. 5. Dehydration-treating with IV fluids as noted. 6. Dementia- her symptoms likely exacerbated by the acute infection.  Treating UTI as above. 7. Chronic left heel ulcer-patient recently had imaging done on 04/05/2018 in the ED with no signs of osteomyelitis found.  I have asked for a wound care nurse consultation and for Prevalon boots to protect both heels.  DVT Prophylaxis: Lovenox Code Status: DNR Disposition Plan: Skilled nursing when medically stabilized  Time spent: 80 minutes  Irwin Brakeman, MD [facility] If 7PM-7AM, please contact night-coverage 04/19/2018, 2:45 PM

## 2018-04-19 NOTE — ED Notes (Signed)
Pt in CT.

## 2018-04-19 NOTE — ED Notes (Signed)
Attempted stroke swallow  Pt would not swallow, not was she ovserved to swallow in 5 minutes of observation

## 2018-04-19 NOTE — ED Notes (Addendum)
Awaiting admission physician

## 2018-04-20 ENCOUNTER — Inpatient Hospital Stay (HOSPITAL_COMMUNITY): Payer: Medicare Other

## 2018-04-20 LAB — CBC WITH DIFFERENTIAL/PLATELET
ABS IMMATURE GRANULOCYTES: 0.03 10*3/uL (ref 0.00–0.07)
Basophils Absolute: 0 10*3/uL (ref 0.0–0.1)
Basophils Relative: 0 %
Eosinophils Absolute: 0.3 10*3/uL (ref 0.0–0.5)
Eosinophils Relative: 3 %
HCT: 29.9 % — ABNORMAL LOW (ref 36.0–46.0)
Hemoglobin: 9.1 g/dL — ABNORMAL LOW (ref 12.0–15.0)
Immature Granulocytes: 0 %
LYMPHS ABS: 3.1 10*3/uL (ref 0.7–4.0)
Lymphocytes Relative: 33 %
MCH: 25.6 pg — ABNORMAL LOW (ref 26.0–34.0)
MCHC: 30.4 g/dL (ref 30.0–36.0)
MCV: 84 fL (ref 80.0–100.0)
Monocytes Absolute: 0.6 10*3/uL (ref 0.1–1.0)
Monocytes Relative: 6 %
Neutro Abs: 5.4 10*3/uL (ref 1.7–7.7)
Neutrophils Relative %: 58 %
Platelets: 206 10*3/uL (ref 150–400)
RBC: 3.56 MIL/uL — ABNORMAL LOW (ref 3.87–5.11)
RDW: 17.3 % — ABNORMAL HIGH (ref 11.5–15.5)
WBC: 9.4 10*3/uL (ref 4.0–10.5)
nRBC: 0 % (ref 0.0–0.2)

## 2018-04-20 LAB — BASIC METABOLIC PANEL
ANION GAP: 11 (ref 5–15)
BUN: 15 mg/dL (ref 8–23)
CO2: 21 mmol/L — ABNORMAL LOW (ref 22–32)
Calcium: 8.2 mg/dL — ABNORMAL LOW (ref 8.9–10.3)
Chloride: 111 mmol/L (ref 98–111)
Creatinine, Ser: 0.72 mg/dL (ref 0.44–1.00)
GFR calc Af Amer: >60 mL/min (ref >60)
GFR calc non Af Amer: >60 mL/min (ref >60)
Glucose, Bld: 58 mg/dL — ABNORMAL LOW (ref 70–99)
Potassium: 4 mmol/L (ref 3.5–5.1)
Sodium: 143 mmol/L (ref 135–145)

## 2018-04-20 LAB — MAGNESIUM: Magnesium: 1.7 mg/dL (ref 1.7–2.4)

## 2018-04-20 LAB — MRSA PCR SCREENING: MRSA by PCR: NEGATIVE

## 2018-04-20 MED ORDER — DEXTROSE-NACL 5-0.9 % IV SOLN
INTRAVENOUS | Status: DC
  Administered 2018-04-20 – 2018-04-21 (×2): via INTRAVENOUS

## 2018-04-20 NOTE — Progress Notes (Signed)
PROGRESS NOTE  Cookie Pore  SXJ:[id number]  DOB: 03-May-1928  DOA: 04/19/2018 PCP: Patient, No Pcp Per   Brief Admission Hx: 85 y.o. female with dementia,  reported paraplegia, chronic left heel ulcer, resident of [facility] ALF was sent to ED today with increased agitation, gait instability with history of frequent falls, decreased oral intake, refusing to take medications.  She was admitted with UTI, dehydration.   MDM/Assessment & Plan:   1. ESBL UTI -multidrug-resistant infection with no response to Bactrim that she was taking.  Discontinued Bactrim and treating with meropenem.  Follow blood and urine culture. 2. Leukocytosis-Resolved now. Likely secondary to infection noted above. . 3. Hypotension-Resolved.  Secondary to dehydration, treated with IV fluids.  4. Anemia- chronic: Hg down with IV fluids.  Follow CBC.  Reportedly this is chronic associated with her multiple myeloma. 5. Dehydration-treating with IV fluids as noted. 6. Hypoglycemia - Pt not eating or drinking.  Added dextrose to IVFs.  7. Dementia- her symptoms likely exacerbated by the acute infection.  Treating UTI as above.  The patient appears to be at end of life.  If she does not have any meaningful improvement in next 24 hours I think that full comfort care is most appropriate.  I have asked for a palliative medicine consultation.  MRI brain negative for CVA.  8. Chronic left heel ulcer-patient recently had imaging done on 04/05/2018 in the ED with no signs of osteomyelitis found.  I have asked for a wound care nurse consultation and for Prevalon boots to protect both heels.  DVT Prophylaxis: Lovenox Code Status: DNR Disposition Plan: Continue current inpatient treatments  Consultants:  Palliative medicine  Procedures:    Antimicrobials:  Meropenem 1/12 >>   Subjective: Pt remains essentially unchanged.   Objective: Vitals:   04/19/18 1530 04/19/18 1732 04/19/18 2214 04/20/18 0656  BP: (!) 108/57  126/62 135/84 (!) 142/71  Pulse:  78 82 74  Resp: '17 17 18   ' Temp:  98.5 F (36.9 C) 97.7 F (36.5 C) 98.1 F (36.7 C)  TempSrc:  Oral Oral Oral  SpO2:  98% 100%   Weight:  67.1 kg    Height:        Intake/Output Summary (Last 24 hours) at 04/20/2018 1040 Last data filed at 04/20/2018 3361 Gross per 24 hour  Intake 1045.45 ml  Output -  Net 1045.45 ml   Filed Weights   04/19/18 0925 04/19/18 1400 04/19/18 1732  Weight: 72.6 kg 72.6 kg 67.1 kg   REVIEW OF SYSTEMS  UTO due to patient's condition  Exam:  General exam: elderly female with dementia, in fetal position, moaning and grunting. Not speaking.   Respiratory system: shallow breathing bilateral.  No increased work of breathing. Cardiovascular system: normal S1 & S2 heard. No JVD, murmurs, gallops, clicks or pedal edema. Gastrointestinal system: Abdomen is nondistended, soft and nontender. Normal bowel sounds heard. Central nervous system: Alert and oriented. No focal neurological deficits. Extremities: bilateral LE edema.  Data Reviewed: Basic Metabolic Panel: Recent Labs  Lab 04/19/18 0947 04/20/18 0608  NA 141 143  K 4.2 4.0  CL 106 111  CO2 28 21*  GLUCOSE 107* 58*  BUN 19 15  CREATININE 0.97 0.72  CALCIUM 8.9 8.2*  MG  --  1.7   Liver Function Tests: Recent Labs  Lab 04/19/18 1030  AST 22  ALT 19  ALKPHOS 68  BILITOT 0.4  PROT 6.3*  ALBUMIN 2.8*   No results for input(s): LIPASE, AMYLASE  in the last 168 hours. No results for input(s): AMMONIA in the last 168 hours. CBC: Recent Labs  Lab 04/19/18 0947 04/20/18 0608  WBC 10.9* 9.4  NEUTROABS 6.7 5.4  HGB 10.4* 9.1*  HCT 34.1* 29.9*  MCV 82.4 84.0  PLT 218 206   Cardiac Enzymes: Recent Labs  Lab 04/19/18 1030  TROPONINI <0.03   CBG (last 3)  No results for input(s): GLUCAP in the last 72 hours. Recent Results (from the past 240 hour(s))  Culture, blood (Routine X 2) w Reflex to ID Panel     Status: None (Preliminary result)    Collection Time: 04/19/18  9:47 AM  Result Value Ref Range Status   Specimen Description BLOOD RIGHT ARM  Final   Special Requests   Final    BOTTLES DRAWN AEROBIC AND ANAEROBIC Blood Culture results may not be optimal due to an excessive volume of blood received in culture bottles   Culture   Final    NO GROWTH < 24 HOURS Performed at [facility], [address], [place], [place] [address]    Report Status PENDING  Incomplete  Culture, blood (Routine X 2) w Reflex to ID Panel     Status: None (Preliminary result)   Collection Time: 04/19/18 10:30 AM  Result Value Ref Range Status   Specimen Description BLOOD LEFT ARM  Final   Special Requests   Final    BOTTLES DRAWN AEROBIC AND ANAEROBIC Blood Culture adequate volume   Culture   Final    NO GROWTH < 24 HOURS Performed at [facility], [address] [place], [place] [address]    Report Status PENDING  Incomplete  MRSA PCR Screening     Status: None   Collection Time: 04/19/18  6:32 PM  Result Value Ref Range Status   MRSA by PCR NEGATIVE NEGATIVE Final    Comment:        The GeneXpert MRSA Assay (FDA approved for NASAL specimens only), is one component of a comprehensive MRSA colonization surveillance program. It is not intended to diagnose MRSA infection nor to guide or monitor treatment for MRSA infections. Performed at [facility], [address] [place], [place] [address]      Studies: Dg Chest 1 View  Result Date: 04/19/2018 CLINICAL DATA:  Weakness. EXAM: CHEST  1 VIEW COMPARISON:  Radiograph of January 21, 2018. FINDINGS: Stable cardiomegaly. Atherosclerosis of thoracic aorta is noted. Minimal right basilar subsegmental atelectasis is noted. Left lung is unremarkable. The visualized skeletal structures are unremarkable. IMPRESSION: Minimal right basilar subsegmental atelectasis. Aortic Atherosclerosis (ICD10-I70.0). Electronically Signed   By: Marijo Conception, M.D.   On: 04/19/2018 11:34   Ct Head Wo  Contrast  Result Date: 04/19/2018 CLINICAL DATA:  Patient with agitation.  Altered mental status. EXAM: CT HEAD WITHOUT CONTRAST TECHNIQUE: Contiguous axial images were obtained from the base of the skull through the vertex without intravenous contrast. COMPARISON:  Brain CT 01/21/2018 FINDINGS: Brain: Ventricles and sulci are prominent compatible with atrophy. Periventricular and subcortical white matter hypodensities compatible with chronic microvascular ischemic changes. No evidence for acute cortically based infarct, intracranial hemorrhage, mass lesion or mass-effect. Vascular: Unremarkable Skull: Intact. Sinuses/Orbits: Paranasal sinuses are well aerated. Mastoid air cells are unremarkable. Other: None. IMPRESSION: No acute intracranial process. Atrophy and chronic microvascular ischemic changes. Electronically Signed   By: Lovey Newcomer M.D.   On: 04/19/2018 11:41   Mr Brain Wo Contrast  Result Date: 04/20/2018 CLINICAL DATA:  Altered mental status. EXAM:  MRI HEAD WITHOUT CONTRAST TECHNIQUE: Multiplanar, multiecho pulse sequences of the brain and surrounding structures were obtained without intravenous contrast. COMPARISON:  Head CT 04/19/2018 FINDINGS: The study is mildly to moderately motion degraded. Brain: There is no evidence of acute infarct, intracranial hemorrhage, mass, midline shift, or extra-axial fluid collection. There is moderate cerebral atrophy. Small chronic cortical infarcts are noted in the right frontal and right parietal lobes. Patchy T2 hyperintensities in the cerebral white matter bilaterally are nonspecific but compatible with moderate chronic small vessel ischemic disease. Vascular: Major intracranial vascular flow voids are preserved. Skull and upper cervical spine: No suspicious marrow lesion. Sinuses/Orbits: Bilateral cataract extraction. Paranasal sinuses and mastoid air cells are clear. Other: None. IMPRESSION: 1. Motion degraded examination. 2. No acute intracranial  abnormality. 3. Moderate chronic small vessel ischemic disease and cerebral atrophy. Electronically Signed   By: Logan Bores M.D.   On: 04/20/2018 09:48   Scheduled Meds: . atorvastatin  40 mg Oral q1800  . divalproex  125 mg Oral BID  . enoxaparin (LOVENOX) injection  40 mg Subcutaneous Q24H  . gabapentin  300 mg Oral TID  . multivitamin-lutein  1 capsule Oral Daily  . sertraline  50 mg Oral Daily  . traZODone  50 mg Oral QHS   Continuous Infusions: . sodium chloride 75 mL/hr at 04/20/18 1018  . meropenem (MERREM) IV 1 g (04/20/18 0428)  . sodium chloride      Active Problems:   UTI due to extended-spectrum beta lactamase (ESBL) producing Escherichia coli   Leukocytosis   Anemia   Dementia (HCC)   CHF (congestive heart failure) (HCC)   Chronic ulcer of left heel (HCC)   Multiple myeloma (HCC)   Hypotension   Dehydration  Time spent:   Irwin Brakeman, MD, FAAFP [facility] 04/20/2018, 10:40 AM    LOS: 1 day

## 2018-04-20 NOTE — Consult Note (Signed)
[place] Nurse wound consult note Patient receiving care at [facility], room 314.  Consult performed remotely with the assistance of the primary RN, Lattie Haw and a NT. Reason for Consult: Left heel wound Wound type: Stage 2 PI Pressure Injury POA: Yes Measurement: 3 cm x 3.8 cm x 0.2 Wound bed: 100% pink, clean, no odor, no induration, no erythema. Drainage (amount, consistency, odor) scant serosanginous Periwound: Fluid filled bulla along a portion of the wound margin. Dressing procedure/placement/frequency: Gently cleanse with saline, pat dry. Apply Xeroform dressing Kellie Simmering # 294), place foam dressing over the xeroform.  The foam can remain in place up to 5 days.  Change the Xeroform daily.  Also, continue to use the heel lift boots. Monitor the wound area(s) for worsening of condition such as: Signs/symptoms of infection,  Increase in size,  Development of or worsening of odor, Development of pain, or increased pain at the affected locations.  Notify the medical team if any of these develop.  Thank you for the consult.  Discussed plan of care with the patient and bedside nurse.  [place] nurse will not follow at this time.  Please re-consult the [place] team if needed.  Val Riles, RN, MSN, CWOCN, CNS-BC, pager [phone]

## 2018-04-20 NOTE — Progress Notes (Signed)
Palliative Medicine consult order noted. PMT provider will return to [facility] on Apr 21, 2018. If the patient remains hospitalized, the consult will be evaluated at that time. If recommendations are needed in the interim, please call our office at [phone].   Marjie Skiff Tyese Finken, RN, BSN, [facility] Palliative Medicine Team 04/20/2018 2:17 PM Office [phone]

## 2018-04-20 NOTE — Evaluation (Signed)
Clinical/Bedside Swallow Evaluation Patient Details  Name: Carmen Christian MRN: [id number] Date of Birth: April 21, 1928  Today's Date: 04/20/2018 Time: SLP Start Time (ACUTE ONLY): 1035 SLP Stop Time (ACUTE ONLY): 1101 SLP Time Calculation (min) (ACUTE ONLY): 26 min  Past Medical History:  Past Medical History:  Diagnosis Date  . Anemia   . CHF (congestive heart failure) ([place])   . Dementia ([place])   . Depression   . Dizziness   . High cholesterol   . Hypercholesterolemia   . Multiple myeloma ([place])   . Renal disorder   . Vitamin D deficiency    Past Surgical History: History reviewed. No pertinent surgical history. HPI:  85 y.o. female with dementia,  reported paraplegia, chronic left heel ulcer, resident of [facility] ALF was sent to ED today with increased agitation, gait instability with history of frequent falls, decreased oral intake, refusing to take medications.  She was admitted with UTI, dehydration.    Assessment / Plan / Recommendation Clinical Impression  Clinical swallowing eval completed after Pt was repositioned to be sitting upright; Pt did make humming noises and verbally respond throughout evaluation however no intelligible words were spoken. Pt took ~6 tsp bites of ice cream frequently requiring a verbal cue to "swallow"; note limited oral manipulation of bolus. Pt was unable to follow commands but when presented with a straw she reflexively sucked on the straw and consumed ~300 cc's of thin liquids over the course of evaluation without overt s/sx of aspiration. Recommend initiate D1/puree diet with thin liquids; meds to be crushed in puree. Recommend precautions re: Position pt at 90 degrees for all PO, verbally communicate with her and prepare her encourage her to look at the spoon before presentation given, utilize straw for liquid administration. Alternate bites and sips. ST will follow acutely for diet tolerance. SLP Visit Diagnosis: Dysphagia, unspecified (R13.10)     Aspiration Risk  Moderate aspiration risk    Diet Recommendation Dysphagia 1 (Puree);Thin liquid   Liquid Administration via: Straw Medication Administration: Crushed with puree Supervision: Full supervision/cueing for compensatory strategies Compensations: Minimize environmental distractions;Small sips/bites;Slow rate;Follow solids with liquid Postural Changes: Remain upright for at least 30 minutes after po intake;Seated upright at 90 degrees    Other  Recommendations Oral Care Recommendations: Oral care BID   Follow up Recommendations Skilled Nursing facility;24 hour supervision/assistance      Frequency and Duration min 1 x/week  1 week       Prognosis Prognosis for Safe Diet Advancement: Fair Barriers to Reach Goals: Cognitive deficits      Swallow Study   General Date of Onset: 04/19/18 HPI: 85 y.o. female with dementia,  reported paraplegia, chronic left heel ulcer, resident of [facility] was sent to ED today with increased agitation, gait instability with history of frequent falls, decreased oral intake, refusing to take medications.  She was admitted with UTI, dehydration.  Type of Study: Bedside Swallow Evaluation Previous Swallow Assessment: none in chart Diet Prior to this Study: NPO Temperature Spikes Noted: No Respiratory Status: Room air History of Recent Intubation: No Behavior/Cognition: Lethargic/Drowsy;Doesn't follow directions;Requires cueing Oral Cavity Assessment: Within Functional Limits Oral Care Completed by SLP: Recent completion by staff Oral Cavity - Dentition: Missing dentition Vision: Impaired for self-feeding Self-Feeding Abilities: Total assist Patient Positioning: Upright in bed Baseline Vocal Quality: Normal Volitional Cough: Cognitively unable to elicit Volitional Swallow: Unable to elicit    Oral/Motor/Sensory Function     Ice Chips Ice chips: Not tested   Thin  Liquid Thin Liquid: Within functional limits    Nectar Thick  Nectar Thick Liquid: Not tested   Honey Thick Honey Thick Liquid: Not tested   Puree Puree: Impaired Presentation: Spoon Oral Phase Impairments: Reduced lingual movement/coordination;Poor awareness of bolus Oral Phase Functional Implications: Prolonged oral transit;Oral residue;Oral holding Pharyngeal Phase Impairments: Multiple swallows   Solid     Solid: Not tested     Lielle Vandervort H. Roddie Mc, CCC-SLP Speech Language Pathologist  Wende Bushy 04/20/2018,2:43 PM

## 2018-04-20 NOTE — Care Management (Signed)
Active with [facility] at [place]

## 2018-04-21 ENCOUNTER — Encounter (HOSPITAL_COMMUNITY): Payer: Self-pay | Admitting: *Deleted

## 2018-04-21 DIAGNOSIS — Z7189 Other specified counseling: Secondary | ICD-10-CM

## 2018-04-21 DIAGNOSIS — N3000 Acute cystitis without hematuria: Principal | ICD-10-CM

## 2018-04-21 DIAGNOSIS — I509 Heart failure, unspecified: Secondary | ICD-10-CM

## 2018-04-21 DIAGNOSIS — Z515 Encounter for palliative care: Secondary | ICD-10-CM

## 2018-04-21 DIAGNOSIS — G309 Alzheimer's disease, unspecified: Secondary | ICD-10-CM

## 2018-04-21 DIAGNOSIS — F0281 Dementia in other diseases classified elsewhere with behavioral disturbance: Secondary | ICD-10-CM

## 2018-04-21 DIAGNOSIS — R531 Weakness: Secondary | ICD-10-CM

## 2018-04-21 LAB — CBC WITH DIFFERENTIAL/PLATELET
Abs Immature Granulocytes: 0.04 10*3/uL (ref 0.00–0.07)
BASOS ABS: 0 10*3/uL (ref 0.0–0.1)
Basophils Relative: 0 %
EOS PCT: 4 %
Eosinophils Absolute: 0.4 10*3/uL (ref 0.0–0.5)
HCT: 35.5 % — ABNORMAL LOW (ref 36.0–46.0)
Hemoglobin: 10.5 g/dL — ABNORMAL LOW (ref 12.0–15.0)
IMMATURE GRANULOCYTES: 0 %
Lymphocytes Relative: 33 %
Lymphs Abs: 3.2 10*3/uL (ref 0.7–4.0)
MCH: 25.2 pg — ABNORMAL LOW (ref 26.0–34.0)
MCHC: 29.6 g/dL — ABNORMAL LOW (ref 30.0–36.0)
MCV: 85.3 fL (ref 80.0–100.0)
Monocytes Absolute: 0.4 10*3/uL (ref 0.1–1.0)
Monocytes Relative: 4 %
Neutro Abs: 5.7 10*3/uL (ref 1.7–7.7)
Neutrophils Relative %: 59 %
Platelets: 205 10*3/uL (ref 150–400)
RBC: 4.16 MIL/uL (ref 3.87–5.11)
RDW: 17.9 % — ABNORMAL HIGH (ref 11.5–15.5)
WBC: 9.7 10*3/uL (ref 4.0–10.5)
nRBC: 0 % (ref 0.0–0.2)

## 2018-04-21 LAB — BASIC METABOLIC PANEL
Anion gap: 8 (ref 5–15)
BUN: 9 mg/dL (ref 8–23)
CO2: 22 mmol/L (ref 22–32)
Calcium: 8.3 mg/dL — ABNORMAL LOW (ref 8.9–10.3)
Chloride: 113 mmol/L — ABNORMAL HIGH (ref 98–111)
Creatinine, Ser: 0.61 mg/dL (ref 0.44–1.00)
GFR calc Af Amer: >60 mL/min (ref >60)
GFR calc non Af Amer: >60 mL/min (ref >60)
GLUCOSE: 96 mg/dL (ref 70–99)
Potassium: 3.8 mmol/L (ref 3.5–5.1)
Sodium: 143 mmol/L (ref 135–145)

## 2018-04-21 LAB — URINE CULTURE

## 2018-04-21 LAB — MAGNESIUM: Magnesium: 1.9 mg/dL (ref 1.7–2.4)

## 2018-04-21 NOTE — Consult Note (Signed)
Consultation Note Date: 04/21/2018   Patient Name: Carmen Christian  DOB: 04/19/1928  MRN: [id number]  Age / Sex: 85 y.o., female  PCP: Patient, No Pcp Per Referring Physician: Murlean Iba, MD  Reason for Consultation: Establishing goals of care      HPI/Patient Profile: 85 y.o. female  with past medical history of dementia, CHF, chronic foot ulcer, multiple myeloma, admitted on 04/19/2018 with dehydration and UTI.  She has not been eating and is declining to take medications.  SLP consult notes patient needing frequent reminders to swallow.  Palliative medicine consulted for goals of care.  Clinical Assessment and Goals of Care: Patient at bedside awake and alert.  She answers no when I ask if she is in pain and no when I ask if she has children but does not answer any other questions. Per chart review she is usually verbal but becomes nonverbal with infection.  During this admission she has continued not to eat or drink. I called and left a message for her listed emergency contact [name]. I also called the second emergency contact listed Horald Chestnut, Vaughan Basta states that she is not a Media planner for the patient and cannot engage in goals of care discussion.  She recommends discussing goals of care with her niece [name].  Primary Decision Maker NEXT OF KIN- ?[name]     SUMMARY OF RECOMMENDATIONS   -Goals of care discussion not able to be had with patient, message left for patient's niece [name] requesting return call to palliative medicine team for discussion  Code Status/Advance Care Planning:  DNR   Palliative Prophylaxis:   Aspiration and Frequent Pain Assessment  Additional Recommendations (Limitations, Scope, Preferences):  Full Scope Treatment  Prognosis:    Unable to determine  Discharge Planning: To Be Determined  Primary  Diagnoses: Present on Admission: . UTI due to extended-spectrum beta lactamase (ESBL) producing Escherichia coli . Leukocytosis . Anemia . Dementia ([place]) . Chronic ulcer of left heel ([place]) . Multiple myeloma ([place]) . Hypotension . Dehydration   I have reviewed the medical record, interviewed the patient and family, and examined the patient. The following aspects are pertinent.  Past Medical History:  Diagnosis Date  . Anemia   . CHF (congestive heart failure) ([place])   . Dementia ([place])   . Depression   . Dizziness   . High cholesterol   . Hypercholesterolemia   . Multiple myeloma ([place])   . Renal disorder   . Vitamin D deficiency    Social History   Socioeconomic History  . Marital status: Widowed    Spouse name: Not on file  . Number of children: Not on file  . Years of education: Not on file  . Highest education level: Not on file  Occupational History  . Not on file  Social Needs  . Financial resource strain: Not on file  . Food insecurity:    Worry: Not on file    Inability: Not on file  . Transportation needs:  Medical: Not on file    Non-medical: Not on file  Tobacco Use  . Smoking status: Unknown If Ever Smoked  . Smokeless tobacco: Never Used  Substance and Sexual Activity  . Alcohol use: No    Frequency: Never  . Drug use: Never  . Sexual activity: Not on file  Lifestyle  . Physical activity:    Days per week: Not on file    Minutes per session: Not on file  . Stress: Not on file  Relationships  . Social connections:    Talks on phone: Not on file    Gets together: Not on file    Attends religious service: Not on file    Active member of club or organization: Not on file    Attends meetings of clubs or organizations: Not on file    Relationship status: Not on file  Other Topics Concern  . Not on file  Social History Narrative  . Not on file   History reviewed. No pertinent family history. Scheduled Meds: . atorvastatin  40 mg Oral q1800   . divalproex  125 mg Oral BID  . enoxaparin (LOVENOX) injection  40 mg Subcutaneous Q24H  . gabapentin  300 mg Oral TID  . multivitamin-lutein  1 capsule Oral Daily  . sertraline  50 mg Oral Daily  . traZODone  50 mg Oral QHS   Continuous Infusions: . dextrose 5 % and 0.9% NaCl 70 mL/hr at 04/20/18 1102  . meropenem (MERREM) IV 1 g (04/21/18 0627)  . sodium chloride     PRN Meds:.acetaminophen **OR** acetaminophen, haloperidol lactate, ipratropium-albuterol, LORazepam, ondansetron **OR** ondansetron (ZOFRAN) IV, senna-docusate Medications Prior to Admission:  Prior to Admission medications   Medication Sig Start Date End Date Taking? Authorizing Provider  atorvastatin (LIPITOR) 40 MG tablet Take 40 mg by mouth daily.   Yes [provider]  dexamethasone (DECADRON) 4 MG tablet Take 4 mg by mouth every 14 (fourteen) days.   Yes [provider]  divalproex (DEPAKOTE) 125 MG DR tablet Take 125 mg by mouth 2 (two) times daily.    Yes [provider]  doxycycline (ADOXA) 100 MG tablet Take 100 mg by mouth daily.   Yes [provider]  Fe Fum-FePoly-FA-Vit C-Vit B3 (INTEGRA F) 125-1 MG CAPS Take 1 capsule by mouth daily.   Yes [provider]  furosemide (LASIX) 20 MG tablet Take 20 mg by mouth daily.   Yes [provider]  gabapentin (NEURONTIN) 300 MG capsule Take 300 mg by mouth 3 (three) times daily.   Yes [provider]  LORazepam (ATIVAN) 0.5 MG tablet Take 0.5 mg by mouth every 8 (eight) hours. Take 1/2 tablet daily as need for anxiety   Yes [provider]  multivitamin-lutein (OCUVITE-LUTEIN) CAPS capsule Take 1 capsule by mouth daily.   Yes [provider]  sertraline (ZOLOFT) 50 MG tablet Take 50 mg by mouth daily.    Yes [provider]  Skin Protectants, Misc. (MINERIN) CREA Apply 1 application topically daily as needed (to lower legs for dry skin).   Yes [provider]  traZODone  (DESYREL) 50 MG tablet Take 50 mg by mouth at bedtime.   Yes [provider]  zinc oxide 20 % ointment Apply 1 application topically 3 (three) times daily as needed for irritation (applied to bilateral buttocks).   Yes [provider]  Acetaminophen (TYLENOL ARTHRITIS EXT RELIEF PO) Take 650 mg by mouth daily as needed (for mild pain).  [provider]  albuterol (PROVENTIL HFA;VENTOLIN HFA) 108 (90 Base) MCG/ACT inhaler Inhale 2 puffs into the lungs every 6 (six) hours as needed for wheezing or shortness of breath.    [provider]  amoxicillin (AMOXIL) 500 MG capsule Take 500 mg by mouth 3 (three) times daily. 10 day course starting on 05/20/17    [provider]  azithromycin (ZITHROMAX) 250 MG tablet Take 250 mg by mouth 2 (two) times daily. Take 2 tablets po today then 1 tablet daily for 4 days    [provider]  diclofenac sodium (VOLTAREN) 1 % GEL Apply 2 g topically 4 (four) times daily.    [provider]  meclizine (ANTIVERT) 25 MG tablet Take 25 mg by mouth every 6 (six) hours as needed for dizziness.    [provider]  Vitamin D, Ergocalciferol, (DRISDOL) 50000 units CAPS capsule Take 50,000 Units by mouth 2 (two) times a week. Mondays and Thursdays in the morning    [provider]   No Known Allergies Review of Systems  Unable to perform ROS: Dementia    Physical Exam Vitals signs reviewed.  Constitutional:      Appearance: She is normal weight.  Cardiovascular:     Rate and Rhythm: Normal rate.  Pulmonary:     Effort: Pulmonary effort is normal.  Skin:    General: Skin is warm.  Neurological:     Mental Status: She is alert.     Comments: Flat affect, mostly nonverbal, doesn't follow commands     Vital Signs: BP 114/71 (BP Location: Left Wrist)   Pulse 83   Temp 98.2 F (36.8 C) (Oral)   Resp 16   Ht '5\' 3"'  (1.6 m)   Wt 67.1 kg   SpO2 100%   BMI 26.20 kg/m  Pain Scale: 0-10     Pain Score: 0-No pain   SpO2: SpO2: 100 % O2 Device:SpO2: 100 % O2 Flow Rate: .   IO: Intake/output summary:   Intake/Output Summary (Last 24 hours) at 04/21/2018 1344 Last data filed at 04/21/2018 1300 Gross per 24 hour  Intake 488.36 ml  Output 1 ml  Net 487.36 ml    LBM: Last BM Date: 04/19/18 Baseline Weight: Weight: 72.6 kg Most recent weight: Weight: 67.1 kg     Palliative Assessment/Data: PPS: 10%   Flowsheet Rows     Most Recent Value  Intake Tab  Referral Department  Hospitalist  Unit at Time of Referral  ER  Date Notified  04/19/18  Palliative Care Type  New Palliative care  Reason for referral  Clarify Goals of Care  Date of Admission  04/19/18  # of days IP prior to Palliative referral  0  Clinical Assessment  Psychosocial & Spiritual Assessment  Palliative Care Outcomes      Thank you for this consult. Palliative medicine will continue to follow and assist as needed.   Time In: 1000 Time Out:1050 Time Total: 50 mintues  Greater than 50%  of this time was spent counseling and coordinating care related to the above assessment and plan.  Signed by: Mariana Kaufman, AGNP-C Palliative Medicine    Please contact Palliative Medicine Team phone at [phone] for questions and concerns.  For individual provider: See Shea Evans

## 2018-04-21 NOTE — Progress Notes (Signed)
  Speech Language Pathology Treatment: Dysphagia  Patient Details Name: Carmen Christian MRN: [id number] DOB: 12-27-1928 Today's Date: 04/21/2018 Time: 8676-7209 SLP Time Calculation (min) (ACUTE ONLY): 19 min  Assessment / Plan / Recommendation Clinical Impression  Pt seen for ongoing diagnostic dysphagia therapy. Pt observed with ice chips, thin water via straw sips, puree, and trials of mech soft (graham crackers in puree). Pt required verbal, visual, and tactile cues for intake and lip placement, however consumed thin liquids via straw without incident. Pt initially took a few bites of mech soft textures, however then began to pocket/hold bolus and expectorated into cup. Pt with limited talking/mumbling, however she hummed along constantly once "Amazing Shirlee L[place]ick" played on iphone. Recommend continue diet as ordered, D1/puree and thin liquids via straw and po medications crushed as able in puree. SLP will follow during acute stay. Diet texture prior to admission is unknown.    HPI HPI: 85 y.o. female with dementia,  reported paraplegia, chronic left heel ulcer, resident of [facility] was sent to ED today with increased agitation, gait instability with history of frequent falls, decreased oral intake, refusing to take medications.  She was admitted with UTI, dehydration.       SLP Plan  Continue with current plan of care       Recommendations  Diet recommendations: Dysphagia 1 (puree);Thin liquid Liquids provided via: Straw Medication Administration: Crushed with puree Supervision: Staff to assist with self feeding;Full supervision/cueing for compensatory strategies Compensations: Minimize environmental distractions;Small sips/bites;Slow rate;Follow solids with liquid Postural Changes and/or Swallow Maneuvers: Seated upright 90 degrees;Upright 30-60 min after meal                Oral Care Recommendations: Oral care BID;Staff/trained caregiver to provide oral care Follow up  Recommendations: Skilled Nursing facility;24 hour supervision/assistance SLP Visit Diagnosis: Dysphagia, unspecified (R13.10) Plan: Continue with current plan of care       Thank you,  Genene Churn, [place]                [place] 04/21/2018, 2:36 PM

## 2018-04-21 NOTE — Progress Notes (Signed)
PROGRESS NOTE  Carmen Christian  GVS:[id number]  DOB: January 31, 1929  DOA: 04/19/2018 PCP: Patient, No Pcp Per   Brief Admission Hx: 85 y.o. female with dementia,  reported paraplegia, chronic left heel ulcer, resident of [facility] was sent to ED today with increased agitation, gait instability with history of frequent falls, decreased oral intake, refusing to take medications.  She was admitted with UTI, dehydration.   MDM/Assessment & Plan:   1. ESBL UTI -multidrug-resistant infection with no response to Bactrim that she was taking.  Discontinued Bactrim and treating with meropenem.  Follow blood and urine culture.  Pt seems to be responding to treatment.  Continue.   2. Leukocytosis-Resolved now. Likely secondary to infection noted above. . 3. Hypotension-Resolved.  Secondary to dehydration, treated with IV fluids.  4. Anemia- chronic: Hg down with IV fluids.  Follow CBC.  Reportedly this is chronic associated with her multiple myeloma. 5. Dehydration-treating with IV fluids as noted. 6. Hypoglycemia - Pt not eating or drinking.  Added dextrose to IVFs.  7. Dementia- her symptoms likely exacerbated by the acute infection.  Treating UTI as above.  The patient appears to be at end of life.  I have asked for a palliative medicine consultation for goals of care discsussions.  MRI brain negative for CVA.  8. Chronic left heel ulcer-patient recently had imaging done on 04/05/2018 in the ED with no signs of osteomyelitis found.  I have asked for a wound care nurse consultation and for Prevalon boots to protect both heels.  DVT Prophylaxis: Lovenox Code Status: DNR Disposition Plan: Continue current inpatient treatments  Consultants:  Palliative medicine  Procedures:    Antimicrobials:  Meropenem 1/12 >>   Subjective: Pt more alert and vocalizing today.  She still does not eat or drink.   Objective: Vitals:   04/20/18 1418 04/20/18 1956 04/20/18 2141 04/21/18 0603  BP: 126/64   134/75 114/71  Pulse: 75  77 83  Resp: 16     Temp: 97.7 F (36.5 C)  98.1 F (36.7 C) 98.2 F (36.8 C)  TempSrc: Oral  Oral Oral  SpO2: 97% 92% 96% 100%  Weight:      Height:        Intake/Output Summary (Last 24 hours) at 04/21/2018 1626 Last data filed at 04/21/2018 1300 Gross per 24 hour  Intake 120 ml  Output 1 ml  Net 119 ml   Filed Weights   04/19/18 0925 04/19/18 1400 04/19/18 1732  Weight: 72.6 kg 72.6 kg 67.1 kg   REVIEW OF SYSTEMS  UTO due to patient's condition  Exam:  General exam: elderly female with dementia, in fetal position, moaning and grunting. Not speaking.   Respiratory system: shallow breathing bilateral.  No increased work of breathing. Cardiovascular system: normal S1 & S2 heard. No JVD, murmurs, gallops, clicks or pedal edema. Gastrointestinal system: Abdomen is nondistended, soft and nontender. Normal bowel sounds heard. Central nervous system: Alert and oriented. No focal neurological deficits. Extremities: bilateral LE edema.  Data Reviewed: Basic Metabolic Panel: Recent Labs  Lab 04/19/18 0947 04/20/18 0608 04/21/18 0543  NA 141 143 143  K 4.2 4.0 3.8  CL 106 111 113*  CO2 28 21* 22  GLUCOSE 107* 58* 96  BUN '19 15 9  ' CREATININE 0.97 0.72 0.61  CALCIUM 8.9 8.2* 8.3*  MG  --  1.7 1.9   Liver Function Tests: Recent Labs  Lab 04/19/18 1030  AST 22  ALT 19  ALKPHOS 68  BILITOT 0.4  PROT 6.3*  ALBUMIN 2.8*   No results for input(s): LIPASE, AMYLASE in the last 168 hours. No results for input(s): AMMONIA in the last 168 hours. CBC: Recent Labs  Lab 04/19/18 0947 04/20/18 0608 04/21/18 0543  WBC 10.9* 9.4 9.7  NEUTROABS 6.7 5.4 5.7  HGB 10.4* 9.1* 10.5*  HCT 34.1* 29.9* 35.5*  MCV 82.4 84.0 85.3  PLT 218 206 205   Cardiac Enzymes: Recent Labs  Lab 04/19/18 1030  TROPONINI <0.03   CBG (last 3)  No results for input(s): GLUCAP in the last 72 hours. Recent Results (from the past 240 hour(s))  Culture, blood  (Routine X 2) w Reflex to ID Panel     Status: None (Preliminary result)   Collection Time: 04/19/18  9:47 AM  Result Value Ref Range Status   Specimen Description BLOOD RIGHT ARM  Final   Special Requests   Final    BOTTLES DRAWN AEROBIC AND ANAEROBIC Blood Culture results may not be optimal due to an excessive volume of blood received in culture bottles   Culture   Final    NO GROWTH 2 DAYS Performed at [facility], [address] [place], [place] [address]    Report Status PENDING  Incomplete  Urine Culture     Status: Abnormal   Collection Time: 04/19/18 10:21 AM  Result Value Ref Range Status   Specimen Description   Final    URINE, CATHETERIZED Performed at [facility], [address], [place], [place] [address]    Special Requests   Final    NONE Performed at [facility], [address], [place], [place] [address]    Culture (A)  Final    >=100,000 COLONIES/mL ESCHERICHIA COLI Confirmed Extended Spectrum Beta-Lactamase Producer (ESBL).  In bloodstream infections from ESBL organisms, carbapenems are preferred over piperacillin/tazobactam. They are shown to have a lower risk of mortality.    Report Status 04/21/2018 FINAL  Final   Organism ID, Bacteria ESCHERICHIA COLI (A)  Final      Susceptibility   Escherichia coli - MIC*    AMPICILLIN >=32 RESISTANT Resistant     CEFAZOLIN >=64 RESISTANT Resistant     CEFTRIAXONE >=64 RESISTANT Resistant     CIPROFLOXACIN >=4 RESISTANT Resistant     GENTAMICIN <=1 SENSITIVE Sensitive     IMIPENEM <=0.25 SENSITIVE Sensitive     NITROFURANTOIN <=16 SENSITIVE Sensitive     TRIMETH/SULFA >=320 RESISTANT Resistant     AMPICILLIN/SULBACTAM >=32 RESISTANT Resistant     PIP/TAZO 64 INTERMEDIATE Intermediate     Extended ESBL POSITIVE Resistant     * >=100,000 COLONIES/mL ESCHERICHIA COLI  Culture, blood (Routine X 2) w Reflex to ID Panel     Status: None (Preliminary result)   Collection Time: 04/19/18 10:30 AM  Result Value Ref Range  Status   Specimen Description BLOOD LEFT ARM  Final   Special Requests   Final    BOTTLES DRAWN AEROBIC AND ANAEROBIC Blood Culture adequate volume   Culture   Final    NO GROWTH 2 DAYS Performed at [facility], [address], [place], [place] [address]    Report Status PENDING  Incomplete  MRSA PCR Screening     Status: None   Collection Time: 04/19/18  6:32 PM  Result Value Ref Range Status   MRSA by PCR NEGATIVE NEGATIVE Final    Comment:        The GeneXpert MRSA Assay (FDA approved for NASAL specimens only), is one component of  a comprehensive MRSA colonization surveillance program. It is not intended to diagnose MRSA infection nor to guide or monitor treatment for MRSA infections. Performed at [facility], [address], [place], [place] [address]      Studies: Mr Brain Wo Contrast  Result Date: 04/20/2018 CLINICAL DATA:  Altered mental status. EXAM: MRI HEAD WITHOUT CONTRAST TECHNIQUE: Multiplanar, multiecho pulse sequences of the brain and surrounding structures were obtained without intravenous contrast. COMPARISON:  Head CT 04/19/2018 FINDINGS: The study is mildly to moderately motion degraded. Brain: There is no evidence of acute infarct, intracranial hemorrhage, mass, midline shift, or extra-axial fluid collection. There is moderate cerebral atrophy. Small chronic cortical infarcts are noted in the right frontal and right parietal lobes. Patchy T2 hyperintensities in the cerebral white matter bilaterally are nonspecific but compatible with moderate chronic small vessel ischemic disease. Vascular: Major intracranial vascular flow voids are preserved. Skull and upper cervical spine: No suspicious marrow lesion. Sinuses/Orbits: Bilateral cataract extraction. Paranasal sinuses and mastoid air cells are clear. Other: None. IMPRESSION: 1. Motion degraded examination. 2. No acute intracranial abnormality. 3. Moderate chronic small vessel ischemic disease and cerebral atrophy.  Electronically Signed   By: Logan Bores M.D.   On: 04/20/2018 09:48   Scheduled Meds: . atorvastatin  40 mg Oral q1800  . divalproex  125 mg Oral BID  . enoxaparin (LOVENOX) injection  40 mg Subcutaneous Q24H  . gabapentin  300 mg Oral TID  . multivitamin-lutein  1 capsule Oral Daily  . sertraline  50 mg Oral Daily  . traZODone  50 mg Oral QHS   Continuous Infusions: . dextrose 5 % and 0.9% NaCl 70 mL/hr at 04/20/18 1102  . meropenem (MERREM) IV 1 g (04/21/18 0627)  . sodium chloride      Active Problems:   UTI due to extended-spectrum beta lactamase (ESBL) producing Escherichia coli   Leukocytosis   Anemia   Dementia (HCC)   CHF (congestive heart failure) (HCC)   Chronic ulcer of left heel (HCC)   Multiple myeloma (HCC)   Hypotension   Dehydration   Advanced care planning/counseling discussion   Goals of care, counseling/discussion   Palliative care by specialist  Time spent:   Irwin Brakeman, MD [facility] 04/21/2018, 4:26 PM    LOS: 2 days

## 2018-04-22 DIAGNOSIS — E860 Dehydration: Secondary | ICD-10-CM

## 2018-04-22 LAB — BASIC METABOLIC PANEL
ANION GAP: 5 (ref 5–15)
BUN: 8 mg/dL (ref 8–23)
CO2: 24 mmol/L (ref 22–32)
Calcium: 7.7 mg/dL — ABNORMAL LOW (ref 8.9–10.3)
Chloride: 114 mmol/L — ABNORMAL HIGH (ref 98–111)
Creatinine, Ser: 0.66 mg/dL (ref 0.44–1.00)
GFR calc Af Amer: >60 mL/min (ref >60)
GFR calc non Af Amer: >60 mL/min (ref >60)
Glucose, Bld: 91 mg/dL (ref 70–99)
Potassium: 3.3 mmol/L — ABNORMAL LOW (ref 3.5–5.1)
SODIUM: 143 mmol/L (ref 135–145)

## 2018-04-22 LAB — CBC WITH DIFFERENTIAL/PLATELET
Abs Immature Granulocytes: 0.04 10*3/uL (ref 0.00–0.07)
BASOS PCT: 1 %
Basophils Absolute: 0 10*3/uL (ref 0.0–0.1)
Eosinophils Absolute: 0.5 10*3/uL (ref 0.0–0.5)
Eosinophils Relative: 6 %
HCT: 26.4 % — ABNORMAL LOW (ref 36.0–46.0)
Hemoglobin: 8 g/dL — ABNORMAL LOW (ref 12.0–15.0)
Immature Granulocytes: 1 %
Lymphocytes Relative: 45 %
Lymphs Abs: 3.8 10*3/uL (ref 0.7–4.0)
MCH: 25.2 pg — ABNORMAL LOW (ref 26.0–34.0)
MCHC: 30.3 g/dL (ref 30.0–36.0)
MCV: 83 fL (ref 80.0–100.0)
Monocytes Absolute: 0.5 10*3/uL (ref 0.1–1.0)
Monocytes Relative: 6 %
NRBC: 0 % (ref 0.0–0.2)
Neutro Abs: 3.3 10*3/uL (ref 1.7–7.7)
Neutrophils Relative %: 41 %
Platelets: 162 10*3/uL (ref 150–400)
RBC: 3.18 MIL/uL — ABNORMAL LOW (ref 3.87–5.11)
RDW: 17.6 % — ABNORMAL HIGH (ref 11.5–15.5)
WBC: 8.1 10*3/uL (ref 4.0–10.5)

## 2018-04-22 LAB — MAGNESIUM: Magnesium: 1.7 mg/dL (ref 1.7–2.4)

## 2018-04-22 MED ORDER — MAGNESIUM SULFATE IN D5W 1-5 GM/100ML-% IV SOLN
1.0000 g | Freq: Once | INTRAVENOUS | Status: AC
  Administered 2018-04-22: 1 g via INTRAVENOUS
  Filled 2018-04-22 (×2): qty 100

## 2018-04-22 MED ORDER — POTASSIUM CHLORIDE 10 MEQ/100ML IV SOLN
10.0000 meq | INTRAVENOUS | Status: AC
  Administered 2018-04-22 (×3): 10 meq via INTRAVENOUS
  Filled 2018-04-22: qty 100

## 2018-04-22 NOTE — Clinical Social Work Note (Signed)
Clinical Social Work Assessment  Patient Details  Name: Carmen Christian MRN: [id number] Date of Birth: 06-20-1928  Date of referral:  04/22/18               Reason for consult:  Facility Placement                Permission sought to share information with:    Permission granted to share information::     Name::        Agency::  Trudy at [facility]   Relationship::     Contact Information:     Housing/Transportation Living arrangements for the past 2 months:  [place] of Information:  Facility Patient Interpreter Needed:  None Criminal Activity/Legal Involvement Pertinent to Current Situation/Hospitalization:  No - Comment as needed Significant Relationships:  Other Family Members, Other(Comment)(niece and church family ) Lives with:  Facility Resident Do you feel safe going back to the place where you live?  Yes Need for family participation in patient care:  Yes (Comment)  Care giving concerns:  No care giving concerns.    Social Worker assessment / plan:  Patient has been a resident at [facility] for more than two years. She is total care at this time.  [name] indicates that she will come and assess patient today but feels she is ok to return at discharge. Patient has support from her niece and her church family.  Patient uses a wheelchair.  Trudy request HHPT through [employer]. CM notified.  Niece, Ms. Trenton Gammon, was agreeable to patient's return to [facility] at discharge.   Employment status:  Retired Forensic scientist:  Medicare PT Recommendations:  Not assessed at this time [place] / Referral to community resources:  [place]  Patient/Family's Response to care:  Patient is a long term resident at [facility] and family plans for her to return at discharge.   Patient/Family's Understanding of and Emotional Response to Diagnosis, Current Treatment, and Prognosis:  Patient's sister, Ms. Trenton Gammon was advised of diagnosis, treatment and  prognosis and desires to speak to attending.   Emotional Assessment Appearance:  Appears stated age Attitude/Demeanor/Rapport:    Affect (typically observed):  Calm Orientation:  Oriented to Self Alcohol / Substance use:  Not Applicable Psych involvement (Current and /or in the community):  No (Comment)  Discharge Needs  Concerns to be addressed:  Discharge Planning Concerns Readmission within the last 30 days:  No Current discharge risk:  None Barriers to Discharge:  No Barriers Identified   Ihor Gully, LCSW 04/22/2018, 5:15 PM

## 2018-04-22 NOTE — Progress Notes (Signed)
Daily Progress Note   Patient Name: Carmen Christian       Date: 04/22/2018 DOB: 05-14-1928  Age: 85 y.o. MRN#: [id number] Attending Physician: Murlean Iba, MD Primary Care Physician: Patient, No Pcp Per Admit Date: 04/19/2018  Reason for Consultation/Follow-up: Establishing goals of care  Subjective: Patient more awake and alert today. Follows commands. Interacting, pleasantly confused, speech is somewhat unitelligible. Eating minimal by hand feeding.  Met with Carmen Christian who is listed at patient's emergency contact. Carmen Christian has been patient's CNA for several years and took her in as a family member due to patient having no other family to care for her. She is in contact with patient's on living family members- a great niece- Carmen Christian and the other listed contact to whom I spoke yesterday- Carmen Christian.  Per Carmen Christian patient has had noted decline in the last year. She has noted changes in her eating- over Christmas she watched as patient was unable to feed herself and showed little interest in eating and drinking. She states patient is nonambulatory, but does get up into her chair at the the facility.  Patient enjoys listening to [place].  We discussed the trajectory of dementia and the likelihood that patient is at the end stages of dementia and possibility that she will have recurrences of dehydration and UTI's. Hospice services were offered.  Carmen Christian is not comfortable being decision maker for patient. She agrees that Hospice would be in patient's best interest.  She is going to speak to patient's great niece- Carmen Christian and encourage Carmen Christian to take over as a Media planner.   Review of Systems  Unable to perform ROS: Dementia    Length of Stay: 3  Current  Medications: Scheduled Meds:  . atorvastatin  40 mg Oral q1800  . divalproex  125 mg Oral BID  . enoxaparin (LOVENOX) injection  40 mg Subcutaneous Q24H  . gabapentin  300 mg Oral TID  . multivitamin-lutein  1 capsule Oral Daily  . sertraline  50 mg Oral Daily  . traZODone  50 mg Oral QHS    Continuous Infusions: . dextrose 5 % and 0.9% NaCl 50 mL/hr at 04/22/18 0942  . meropenem (MERREM) IV 1 g (04/22/18 0254)  . sodium chloride      PRN Meds: acetaminophen **OR** acetaminophen, haloperidol lactate, ipratropium-albuterol, LORazepam,  ondansetron **OR** ondansetron (ZOFRAN) IV, senna-docusate  Physical Exam Vitals signs and nursing note reviewed.  Constitutional:      General: She is not in acute distress.    Appearance: She is normal weight. She is not ill-appearing.  Cardiovascular:     Rate and Rhythm: Normal rate.  Pulmonary:     Effort: Pulmonary effort is normal.  Neurological:     Mental Status: She is alert. She is disoriented.  Psychiatric:        Mood and Affect: Mood normal.             Vital Signs: BP 109/65 (BP Location: Left Wrist)   Pulse (!) 54   Temp 97.7 F (36.5 C) (Oral)   Resp 16   Ht '5\' 3"'  (1.6 m)   Wt 69.3 kg   SpO2 98%   BMI 27.06 kg/m  SpO2: SpO2: 98 % O2 Device: O2 Device: Room Air O2 Flow Rate:    Intake/output summary:   Intake/Output Summary (Last 24 hours) at 04/22/2018 1325 Last data filed at 04/21/2018 1700 Gross per 24 hour  Intake 120 ml  Output -  Net 120 ml   LBM: Last BM Date: 04/19/18 Baseline Weight: Weight: 72.6 kg Most recent weight: Weight: 69.3 kg       Palliative Assessment/Data: PPS: 20%    Flowsheet Rows     Most Recent Value  Intake Tab  Referral Department  Hospitalist  Unit at Time of Referral  ER  Date Notified  04/19/18  Palliative Care Type  New Palliative care  Reason for referral  Clarify Goals of Care  Date of Admission  04/19/18  Date first seen by Palliative Care  04/21/18  # of days  Palliative referral response time  2 Day(s)  # of days IP prior to Palliative referral  0  Clinical Assessment  Psychosocial & Spiritual Assessment  Palliative Care Outcomes      Patient Active Problem List   Diagnosis Date Noted  . Advanced care planning/counseling discussion   . Goals of care, counseling/discussion   . Palliative care by specialist   . Acute cystitis without hematuria   . Weakness   . UTI due to extended-spectrum beta lactamase (ESBL) producing Escherichia coli 04/19/2018  . Leukocytosis 04/19/2018  . Anemia 04/19/2018  . Dementia ([place]) 04/19/2018  . CHF (congestive heart failure) ([place]) 04/19/2018  . Chronic ulcer of left heel ([place]) 04/19/2018  . Multiple myeloma ([place]) 04/19/2018  . Hypotension 04/19/2018  . Dehydration 04/19/2018    Palliative Care Assessment & Plan   Patient Profile: 85 y.o. female  with past medical history of dementia, CHF, chronic foot ulcer, multiple myeloma, admitted on 04/19/2018 with dehydration and UTI.  She has not been eating and is declining to take medications.  SLP consult notes patient needing frequent reminders to swallow.  Palliative medicine consulted for goals of care.  Assessment/Recommendations/Plan   Continue full scope care  A healthcare decision maker needs to be identified for patient- her emergency contact- Carmen Christian is not comfortable making decisions- the other contact Carmen Christian indicated she was not a Media planner during our conversation yesterday.   Ms. Carmen Christian is going to contact patient's niece- Carmen Christian and encourage her to take over as Media planner. I discussed the option of making patient ward of the state if no one is able to take over as decision maker  Goals of Care and Additional Recommendations:  Limitations on Scope of Treatment: Full Scope Treatment  Code Status:  DNR  Prognosis:   < 6 months due to advanced dementia, patient declining in nutrition and functional  status  Discharge Planning:  To Be Determined  Care plan was discussed with patient's caregiver- Carmen Christian.  Thank you for allowing the Palliative Medicine Team to assist in the care of this patient.   Time In: 1130 Time Out: 1215 Total Time 45 minutes Prolonged Time Billed no      Greater than 50%  of this time was spent counseling and coordinating care related to the above assessment and plan.  Carmen Christian, AGNP-C Palliative Medicine   Please contact Palliative Medicine Team phone at [phone] for questions and concerns.

## 2018-04-22 NOTE — Progress Notes (Signed)
04/22/2018 5:59 PM  I tried twice to contact patient's sister [name] but no answer x 2.    Murvin Natal MD

## 2018-04-22 NOTE — Progress Notes (Signed)
No charge note:   Received message from [phone]- attempted to call back- left VM requesting return call, will also try to call her again in the morning.   Mariana Kaufman, AGNP-C Palliative Medicine  Please call Palliative Medicine team phone with any questions [phone]. For individual providers please see AMION.

## 2018-04-22 NOTE — NC FL2 (Signed)
[place] LEVEL OF CARE SCREENING TOOL     IDENTIFICATION  Patient Name: Carmen Christian Birthdate: 1928/05/24 Sex: female Admission Date (Current Location): 04/19/2018  [facility] and [place] Number:  [employer] and Address:  [place] [address], [place]      Provider Number: [id number]  Attending Physician Name and Address:  Murlean Iba, MD  Relative Name and Phone Number:       Current Level of Care: Hospital Recommended Level of Care: [place] Prior Approval Number:    Date Approved/Denied:   PASRR Number:    Discharge Plan: Domiciliary (Rest home)(ALF)    Current Diagnoses: Patient Active Problem List   Diagnosis Date Noted  . Advanced care planning/counseling discussion   . Goals of care, counseling/discussion   . Palliative care by specialist   . Acute cystitis without hematuria   . Weakness   . UTI due to extended-spectrum beta lactamase (ESBL) producing Escherichia coli 04/19/2018  . Leukocytosis 04/19/2018  . Anemia 04/19/2018  . Dementia ([place]) 04/19/2018  . CHF (congestive heart failure) ([place]) 04/19/2018  . Chronic ulcer of left heel ([place]) 04/19/2018  . Multiple myeloma ([place]) 04/19/2018  . Hypotension 04/19/2018  . Dehydration 04/19/2018    Orientation RESPIRATION BLADDER Height & Weight     Self  Normal Incontinent Weight: 152 lb 12.5 oz (69.3 kg) Height:  5' 3" (160 cm)  BEHAVIORAL SYMPTOMS/MOOD NEUROLOGICAL BOWEL NUTRITION STATUS      Incontinent Diet( Dysphagia 1 (puree);Thin liquid. Medication Administration: Crushed with puree)  AMBULATORY STATUS COMMUNICATION OF NEEDS Skin   Extensive Assist(uses wheelchair) Verbally PU Stage and Appropriate Care(stage II heel )   PU Stage 2 Dressing: (P) (left heel )                   Personal Care Assistance Level of Assistance  Bathing, Feeding, Dressing Bathing Assistance: Maximum assistance Feeding assistance: Maximum  assistance Dressing Assistance: Maximum assistance     Functional Limitations Info             SPECIAL CARE FACTORS FREQUENCY  PT (By licensed PT)     PT Frequency: 3xweek              Contractures Contractures Info: Not present    Additional Factors Info  Code Status, Allergies, Psychotropic, Isolation Precautions Code Status Info: DNR Allergies Info: NKA  Psychotropic Info: depakote, ativan, desyrel   Isolation Precautions Info: ESBL + urine culture 04/19/2018      Current Medications (04/22/2018):  This is the current hospital active medication list Current Facility-Administered Medications  Medication Dose Route Frequency Provider Last Rate Last Dose  . acetaminophen (TYLENOL) tablet 650 mg  650 mg Oral Q6H PRN Johnson, Clanford L, MD   650 mg at 04/22/18 1426   Or  . acetaminophen (TYLENOL) suppository 650 mg  650 mg Rectal Q6H PRN Johnson, Clanford L, MD      . atorvastatin (LIPITOR) tablet 40 mg  40 mg Oral q1800 Johnson, Clanford L, MD   40 mg at 04/22/18 1721  . dextrose 5 %-0.9 % sodium chloride infusion   Intravenous Continuous Wynetta Emery, Clanford L, MD 50 mL/hr at 04/22/18 0942    . divalproex (DEPAKOTE) DR tablet 125 mg  125 mg Oral BID Wynetta Emery, Clanford L, MD   125 mg at 04/22/18 0956  . enoxaparin (LOVENOX) injection 40 mg  40 mg Subcutaneous Q24H Johnson, Clanford L, MD   40 mg  at 04/22/18 1626  . gabapentin (NEURONTIN) capsule 300 mg  300 mg Oral TID Wynetta Emery, Clanford L, MD   300 mg at 04/22/18 1626  . haloperidol lactate (HALDOL) injection 1 mg  1 mg Intravenous Q6H PRN Johnson, Clanford L, MD      . ipratropium-albuterol (DUONEB) 0.5-2.5 (3) MG/3ML nebulizer solution 3 mL  3 mL Nebulization Q4H PRN Johnson, Clanford L, MD      . LORazepam (ATIVAN) injection 0.5-1 mg  0.5-1 mg Intravenous Q4H PRN Johnson, Clanford L, MD      . meropenem (MERREM) 1 g in sodium chloride 0.9 % 100 mL IVPB  1 g Intravenous Q12H Johnson, Clanford L, MD 200 mL/hr at 04/22/18 1642  1 g at 04/22/18 1642  . multivitamin-lutein (OCUVITE-LUTEIN) capsule 1 capsule  1 capsule Oral Daily Johnson, Clanford L, MD   1 capsule at 04/22/18 0956  . ondansetron (ZOFRAN) tablet 4 mg  4 mg Oral Q6H PRN Johnson, Clanford L, MD       Or  . ondansetron (ZOFRAN) injection 4 mg  4 mg Intravenous Q6H PRN Johnson, Clanford L, MD      . senna-docusate (Senokot-S) tablet 1 tablet  1 tablet Oral QHS PRN Johnson, Clanford L, MD      . sertraline (ZOLOFT) tablet 50 mg  50 mg Oral Daily Johnson, Clanford L, MD   50 mg at 04/22/18 0957  . sodium chloride 0.9 % bolus 1,000 mL  1,000 mL Intravenous Once Johnson, Clanford L, MD      . traZODone (DESYREL) tablet 50 mg  50 mg Oral QHS Johnson, Clanford L, MD   50 mg at 04/21/18 2240     Discharge Medications: Please see discharge summary for a list of discharge medications.  Relevant Imaging Results:  Relevant Lab Results:   Additional Information    Karlina Suares, Clydene Pugh, LCSW

## 2018-04-22 NOTE — Progress Notes (Signed)
Pharmacy Antibiotic Note  Today is day #5 of meropenem therapy for this 84 yo female with ESBL UTI.  Patient is currently afebrile and WBC count is WNL.  Blood cultures have shown no growth. Renal function is unstable.  Plan: Continue Merrem 1gm IV q12h F/U Cx and clinical progress Monitor V/S, labs  Height: 5\' 3"  (160 cm) Weight: 152 lb 12.5 oz (69.3 kg) IBW/kg (Calculated) : 52.4  Temp (24hrs), Avg:98.2 F (36.8 C), Min:97.3 F (36.3 C), Max:99.6 F (37.6 C)  Recent Labs  Lab 04/19/18 0947 04/19/18 1030 04/20/18 0608 04/21/18 0543 04/22/18 0608  WBC 10.9*  --  9.4 9.7 8.1  CREATININE 0.97  --  0.72 0.61 0.66  LATICACIDVEN  --  0.9  --   --   --     Estimated Creatinine Clearance: 46.3 mL/min (by C-G formula based on SCr of 0.66 mg/dL).    No Known Allergies  Antimicrobials this admission: Merrem 1/12 >>  Primaxin x 1 dose given in ED 1/12    Microbiology results: 1/12 BCx: ng x3 days 1/12 Ucx: ESBL e. coli 12/30 UCx E.COLI ESBL 1/12 MRSA PCR: neg   Thank you for allowing pharmacy to be a part of this patient's care.  Isac Sarna, BS [place], [place] Clinical Pharmacist Pager [phone] 04/22/2018 5:19 PM

## 2018-04-22 NOTE — Progress Notes (Addendum)
PROGRESS NOTE  Carmen Christian  FFM:[id number]  DOB: [birth year]  DOA: 04/19/2018 PCP: Patient, No Pcp Per   Brief Admission Hx: 85 y.o. female with dementia,  reported paraplegia, chronic left heel ulcer, resident of [facility] was sent to ED today with increased agitation, gait instability with history of frequent falls, decreased oral intake, refusing to take medications.  She was admitted with UTI, dehydration.   MDM/Assessment & Plan:   1. ESBL UTI -multidrug-resistant infection with no response to Bactrim that she was taking.  Discontinued Bactrim and treating with meropenem which will complete in AM.  Follow blood and urine cultures.  Pt seems to be responding to treatment and clinically some improvement.   2. Leukocytosis-Resolved now. Likely secondary to infection noted above.  3. Hypotension-Resolved.  Secondary to dehydration, treated with IV fluids.  4. Hypokalemia / Hypomagnesemia - IV magnesium and potassium replacement ordered.  5. Anemia- chronic: Hg down with IV fluids.  Follow CBC.  Reportedly this is a chronic anemia. Following CBC. 6. Dehydration-treating with IV fluids as noted. 7. Hypoglycemia - Pt not eating or drinking.  Added dextrose to IVFs.  8. Dementia- her symptoms likely exacerbated by the acute infection.  Treating UTI as above.  The patient appears to be at end of life as she is not eating and drinking enough to maintain adequate nutrition to sustain life.  I have asked for a palliative medicine consultation for goals of care discsussions.  MRI brain negative for CVA.  9. Chronic stage 2 left heel ulcer-patient recently had imaging done on 04/05/2018 in the ED with no signs of osteomyelitis found.  I have asked for a wound care nurse consultation and for Prevalon boots to protect both heels.  Left heel wound  Wound type: Stage 2 PI  Pressure Injury POA: Yes  DVT Prophylaxis: Lovenox Code Status: DNR Disposition Plan: Continue current inpatient  treatments  Consultants:  Palliative medicine  Procedures:    Antimicrobials:  Meropenem 1/12 >>   Subjective: Pt more alert and vocalizing today.  I was able to get her to drink a sip of water from a straw.     Objective: Vitals:   04/20/18 2141 04/21/18 0603 04/21/18 2203 04/22/18 0645  BP: 134/75 114/71 125/73 109/65  Pulse: 77 83 94 (!) 54  Resp:      Temp: 98.1 F (36.7 C) 98.2 F (36.8 C) 99.6 F (37.6 C) 97.7 F (36.5 C)  TempSrc: Oral Oral Oral Oral  SpO2: 96% 100% 95% 98%  Weight:    69.3 kg  Height:        Intake/Output Summary (Last 24 hours) at 04/22/2018 0844 Last data filed at 04/21/2018 1700 Gross per 24 hour  Intake 240 ml  Output -  Net 240 ml   Filed Weights   04/19/18 1400 04/19/18 1732 04/22/18 0645  Weight: 72.6 kg 67.1 kg 69.3 kg   REVIEW OF SYSTEMS  UTO due to patient's condition  Exam:  General exam: elderly female with dementia, in fetal position, moaning and grunting. Not speaking.   Respiratory system: shallow breathing bilateral.  No increased work of breathing. Cardiovascular system: normal S1 & S2 heard. No JVD, murmurs, gallops, clicks or pedal edema. Gastrointestinal system: Abdomen is nondistended, soft and nontender. Normal bowel sounds heard. Central nervous system: Alert and oriented. No focal neurological deficits. Extremities: bilateral LE edema.  Data Reviewed: Basic Metabolic Panel: Recent Labs  Lab 04/19/18 0947 04/20/18 0608 04/21/18 0543 04/22/18 0608  NA 141 143  143 143  K 4.2 4.0 3.8 3.3*  CL 106 111 113* 114*  CO2 28 21* 22 24  GLUCOSE 107* 58* 96 91  BUN _0 CREATININE 0.97 0.72 0.61 0.66  CALCIUM 8.9 8.2* 8.3* 7.7*  MG  --  1.7 1.9 1.7   Liver Function Tests: Recent Labs  Lab 04/19/18 1030  AST 22  ALT 19  ALKPHOS 68  BILITOT 0.4  PROT 6.3*  ALBUMIN 2.8*   No results for input(s): LIPASE, AMYLASE in the last 168 hours. No results for input(s): AMMONIA in the last 168  hours. CBC: Recent Labs  Lab 04/19/18 0947 04/20/18 0608 04/21/18 0543 04/22/18 0608  WBC 10.9* 9.4 9.7 8.1  NEUTROABS 6.7 5.4 5.7 3.3  HGB 10.4* 9.1* 10.5* 8.0*  HCT 34.1* 29.9* 35.5* 26.4*  MCV 82.4 84.0 85.3 83.0  PLT 218 206 205 162   Cardiac Enzymes: Recent Labs  Lab 04/19/18 1030  TROPONINI <0.03   CBG (last 3)  No results for input(s): GLUCAP in the last 72 hours. Recent Results (from the past 240 hour(s))  Culture, blood (Routine X 2) w Reflex to ID Panel     Status: None (Preliminary result)   Collection Time: 04/19/18  9:47 AM  Result Value Ref Range Status   Specimen Description BLOOD RIGHT ARM  Final   Special Requests   Final    BOTTLES DRAWN AEROBIC AND ANAEROBIC Blood Culture results may not be optimal due to an excessive volume of blood received in culture bottles   Culture   Final    NO GROWTH 3 DAYS Performed at [facility], [address] [place], [place] [address]    Report Status PENDING  Incomplete  Urine Culture     Status: Abnormal   Collection Time: 04/19/18 10:21 AM  Result Value Ref Range Status   Specimen Description   Final    URINE, CATHETERIZED Performed at [facility], [address], [place], [place] [address]    Special Requests   Final    NONE Performed at [facility], [address], [place], [place] [address]    Culture (A)  Final    >=100,000 COLONIES/mL ESCHERICHIA COLI Confirmed Extended Spectrum Beta-Lactamase Producer (ESBL).  In bloodstream infections from ESBL organisms, carbapenems are preferred over piperacillin/tazobactam. They are shown to have a lower risk of mortality.    Report Status 04/21/2018 FINAL  Final   Organism ID, Bacteria ESCHERICHIA COLI (A)  Final      Susceptibility   Escherichia coli - MIC*    AMPICILLIN >=32 RESISTANT Resistant     CEFAZOLIN >=64 RESISTANT Resistant     CEFTRIAXONE >=64 RESISTANT Resistant     CIPROFLOXACIN >=4 RESISTANT Resistant     GENTAMICIN <=1 SENSITIVE Sensitive      IMIPENEM <=0.25 SENSITIVE Sensitive     NITROFURANTOIN <=16 SENSITIVE Sensitive     TRIMETH/SULFA >=320 RESISTANT Resistant     AMPICILLIN/SULBACTAM >=32 RESISTANT Resistant     PIP/TAZO 64 INTERMEDIATE Intermediate     Extended ESBL POSITIVE Resistant     * >=100,000 COLONIES/mL ESCHERICHIA COLI  Culture, blood (Routine X 2) w Reflex to ID Panel     Status: None (Preliminary result)   Collection Time: 04/19/18 10:30 AM  Result Value Ref Range Status   Specimen Description BLOOD LEFT ARM  Final   Special Requests   Final    BOTTLES DRAWN AEROBIC AND ANAEROBIC Blood Culture adequate volume   Culture  Final    NO GROWTH 3 DAYS Performed at [facility], [address], [place], [place] [address]    Report Status PENDING  Incomplete  MRSA PCR Screening     Status: None   Collection Time: 04/19/18  6:32 PM  Result Value Ref Range Status   MRSA by PCR NEGATIVE NEGATIVE Final    Comment:        The GeneXpert MRSA Assay (FDA approved for NASAL specimens only), is one component of a comprehensive MRSA colonization surveillance program. It is not intended to diagnose MRSA infection nor to guide or monitor treatment for MRSA infections. Performed at [facility], [address] [place], [place] [address]      Studies: Mr Brain Wo Contrast  Result Date: 04/20/2018 CLINICAL DATA:  Altered mental status. EXAM: MRI HEAD WITHOUT CONTRAST TECHNIQUE: Multiplanar, multiecho pulse sequences of the brain and surrounding structures were obtained without intravenous contrast. COMPARISON:  Head CT 04/19/2018 FINDINGS: The study is mildly to moderately motion degraded. Brain: There is no evidence of acute infarct, intracranial hemorrhage, mass, midline shift, or extra-axial fluid collection. There is moderate cerebral atrophy. Small chronic cortical infarcts are noted in the right frontal and right parietal lobes. Patchy T2 hyperintensities in the cerebral white matter bilaterally are nonspecific but  compatible with moderate chronic small vessel ischemic disease. Vascular: Major intracranial vascular flow voids are preserved. Skull and upper cervical spine: No suspicious marrow lesion. Sinuses/Orbits: Bilateral cataract extraction. Paranasal sinuses and mastoid air cells are clear. Other: None. IMPRESSION: 1. Motion degraded examination. 2. No acute intracranial abnormality. 3. Moderate chronic small vessel ischemic disease and cerebral atrophy. Electronically Signed   By: Logan Bores M.D.   On: 04/20/2018 09:48   Scheduled Meds: . atorvastatin  40 mg Oral q1800  . divalproex  125 mg Oral BID  . enoxaparin (LOVENOX) injection  40 mg Subcutaneous Q24H  . gabapentin  300 mg Oral TID  . multivitamin-lutein  1 capsule Oral Daily  . sertraline  50 mg Oral Daily  . traZODone  50 mg Oral QHS   Continuous Infusions: . dextrose 5 % and 0.9% NaCl 70 mL/hr at 04/21/18 1713  . magnesium sulfate 1 - 4 g bolus IVPB    . meropenem (MERREM) IV 1 g (04/22/18 0254)  . potassium chloride    . sodium chloride      Active Problems:   UTI due to extended-spectrum beta lactamase (ESBL) producing Escherichia coli   Leukocytosis   Anemia   Dementia (HCC)   CHF (congestive heart failure) (HCC)   Chronic ulcer of left heel (HCC)   Multiple myeloma (HCC)   Hypotension   Dehydration   Advanced care planning/counseling discussion   Goals of care, counseling/discussion   Palliative care by specialist   Acute cystitis without hematuria   Weakness  Time spent:   Irwin Brakeman, MD [facility] 04/22/2018, 8:44 AM    LOS: 3 days

## 2018-04-23 LAB — BASIC METABOLIC PANEL
BUN: 7 mg/dL — ABNORMAL LOW (ref 8–23)
CO2: 25 mmol/L (ref 22–32)
Calcium: 7.9 mg/dL — ABNORMAL LOW (ref 8.9–10.3)
Chloride: 114 mmol/L — ABNORMAL HIGH (ref 98–111)
Creatinine, Ser: 0.56 mg/dL (ref 0.44–1.00)
GFR calc Af Amer: >60 mL/min (ref >60)
GFR calc non Af Amer: >60 mL/min (ref >60)
Glucose, Bld: 87 mg/dL (ref 70–99)
Potassium: 3.5 mmol/L (ref 3.5–5.1)
Sodium: 141 mmol/L (ref 135–145)

## 2018-04-23 LAB — CBC WITH DIFFERENTIAL/PLATELET
ABS IMMATURE GRANULOCYTES: 0.02 10*3/uL (ref 0.00–0.07)
BASOS PCT: 1 %
Basophils Absolute: 0.1 10*3/uL (ref 0.0–0.1)
Eosinophils Absolute: 0.6 10*3/uL — ABNORMAL HIGH (ref 0.0–0.5)
Eosinophils Relative: 7 %
HCT: 28.2 % — ABNORMAL LOW (ref 36.0–46.0)
Hemoglobin: 8.4 g/dL — ABNORMAL LOW (ref 12.0–15.0)
Immature Granulocytes: 0 %
Lymphocytes Relative: 53 %
Lymphs Abs: 4.1 10*3/uL — ABNORMAL HIGH (ref 0.7–4.0)
MCH: 25.4 pg — ABNORMAL LOW (ref 26.0–34.0)
MCHC: 29.8 g/dL — ABNORMAL LOW (ref 30.0–36.0)
MCV: 85.2 fL (ref 80.0–100.0)
Monocytes Absolute: 0.3 10*3/uL (ref 0.1–1.0)
Monocytes Relative: 4 %
NEUTROS ABS: 2.7 10*3/uL (ref 1.7–7.7)
Neutrophils Relative %: 35 %
PLATELETS: 175 10*3/uL (ref 150–400)
RBC: 3.31 MIL/uL — ABNORMAL LOW (ref 3.87–5.11)
RDW: 17.9 % — ABNORMAL HIGH (ref 11.5–15.5)
WBC: 7.5 10*3/uL (ref 4.0–10.5)
nRBC: 0 % (ref 0.0–0.2)

## 2018-04-23 NOTE — NC FL2 (Signed)
[place] LEVEL OF CARE SCREENING TOOL     IDENTIFICATION  Patient Name: Carmen Christian Birthdate: 1928-07-04 Sex: female Admission Date (Current Location): 04/19/2018  [facility] and [place] Number:  [employer] and Address:  [place] [address], [place]      Provider Number: [id number]  Attending Physician Name and Address:  Kathie Dike, MD  Relative Name and Phone Number:       Current Level of Care: Hospital Recommended Level of Care: [place] Prior Approval Number:    Date Approved/Denied:   PASRR Number:    Discharge Plan: Domiciliary (Rest home)(ALF)    Current Diagnoses: Patient Active Problem List   Diagnosis Date Noted  . Advanced care planning/counseling discussion   . Goals of care, counseling/discussion   . Palliative care by specialist   . Acute cystitis without hematuria   . Weakness   . UTI due to extended-spectrum beta lactamase (ESBL) producing Escherichia coli 04/19/2018  . Leukocytosis 04/19/2018  . Anemia 04/19/2018  . Dementia ([place]) 04/19/2018  . CHF (congestive heart failure) ([place]) 04/19/2018  . Chronic ulcer of left heel ([place]) 04/19/2018  . Multiple myeloma ([place]) 04/19/2018  . Hypotension 04/19/2018  . Dehydration 04/19/2018    Orientation RESPIRATION BLADDER Height & Weight     Self  Normal Incontinent Weight: 73.4 kg Height:  '5\' 3"'  (160 cm)  BEHAVIORAL SYMPTOMS/MOOD NEUROLOGICAL BOWEL NUTRITION STATUS      Incontinent Diet( Dysphagia 1 (puree);Thin liquid. Medication Administration: Crushed with puree)  AMBULATORY STATUS COMMUNICATION OF NEEDS Skin   Extensive Assist(uses wheelchair) Verbally PU Stage and Appropriate Care(stage II heel )   PU Stage 2 Dressing: (P) (left heel )                   Personal Care Assistance Level of Assistance  Bathing, Feeding, Dressing Bathing Assistance: Maximum assistance Feeding assistance: Maximum assistance Dressing  Assistance: Maximum assistance     Functional Limitations Info             SPECIAL CARE FACTORS FREQUENCY  PT (By licensed PT)     PT Frequency: 3xweek              Contractures Contractures Info: Not present    Additional Factors Info  Code Status, Allergies, Psychotropic, Isolation Precautions Code Status Info: DNR Allergies Info: NKA  Psychotropic Info: depakote, ativan, desyrel   Isolation Precautions Info: ESBL + urine culture 04/19/2018      Current Medications     Discharge Medications: albuterol 108 (90 Base) MCG/ACT inhaler  Commonly known as: PROVENTIL HFA;VENTOLIN HFA  Inhale 2 puffs into the lungs every 6 (six) hours as needed for wheezing or shortness of breath.  Next dose due:          diclofenac sodium 1 % Gel  Commonly known as: VOLTAREN  Apply 2 g topically 4 (four) times daily.  Next dose due:          divalproex 125 MG DR tablet  Commonly known as: DEPAKOTE  Take 125 mg by mouth 2 (two) times daily.  Next dose due:          gabapentin 300 MG capsule  Commonly known as: NEURONTIN  Take 300 mg by mouth 3 (three) times daily.  Next dose due:          LORazepam 0.5 MG tablet  Commonly known as: ATIVAN  Take 0.5 mg by mouth every 8 (eight) hours.  Take 1/2 tablet daily as need for anxiety  Next dose due:          MINERIN Crea  Apply 1 application topically daily as needed (to lower legs for dry skin).  Next dose due:          sertraline 50 MG tablet  Commonly known as: ZOLOFT  Take 50 mg by mouth daily.  Next dose due:          traZODone 50 MG tablet  Commonly known as: DESYREL  Take 50 mg by mouth at bedtime.  Next dose due:          TYLENOL ARTHRITIS EXT RELIEF PO  Take 650 mg by mouth daily as needed (for mild pain).  Next dose due:          zinc oxide 20 % ointment  Apply 1 application topically 3 (three) times daily as needed for irritation (applied to bilateral buttocks).  Next dose due:            Relevant Imaging  Results:  Relevant Lab Results:   Additional [place], LCSW

## 2018-04-23 NOTE — Care Management Note (Signed)
Case Management Note  Patient Details  Name: Carmen Christian MRN: [id number] Date of Birth: Jul 06, 1928   Expected Discharge Date:     04/23/2018             Expected Discharge Plan:  Assisted Living / Rest Home  In-House Referral:  Clinical Social Work, Hospice / Palliative Care  Discharge planning Services  CM Consult  Post Acute Care Choice:  Hospice Choice offered to:  (ALF rep)  DME Arranged:    DME Agency:     HH Arranged:  RN [place] Agency:  Other - See comment([facility])  Status of Service:  Completed, signed off  If discussed at [place] of Stay Meetings, dates discussed:    Additional Comments: DC back to ALF today with hospice. Facility requests [facility] continue with hospice services. CM has made referral and admission will take place after pt returns to [facility] today. Pt has no DME needs at this time. CSW making arrangements for return to facility.   Sherald Barge, RN 04/23/2018, 12:18 PM

## 2018-04-23 NOTE — Progress Notes (Signed)
Palliative medicine note:   Patient in bed awake. Less verbal and interactive than yesterday. She did track me with her eyes. Able to follow commands. Per chart review, PO intake still greatly decreased. She denies pain or other complaints. Sat with patient, informed her that her niece- [name] had agreed to be healthcare decision maker for her. Ms. Milbrath did not appear to comprehend what I was telling her.  Discussed with Dr. Roderic Palau- agree with plan for discharge back to ALF with Hospice- informed him of the outcome of my previous discussion with patient's nieceStanton Kidney.  Mariana Kaufman, AGNP-C Palliative Medicine  Please call Palliative Medicine team phone with any questions [phone]. For individual providers please see AMION.     Face to face time: 25 minutes  Time in: 1100  Time Out: 1125  Greater than 50%  of this time was spent counseling and coordinating care related to the above assessment and plan

## 2018-04-23 NOTE — Care Management (Signed)
Patient Information   Patient Name Vara, Mairena ([id number]) Sex Female DOB 09-11-1928  Room Bed  A314 A314-01  Patient Demographics   Address [place] [address] [place] [address] Phone [phone] (Home)  Patient Ethnicity & Race   Ethnic Group Patient Race  Not Hispanic or Latino Black or African American  Emergency Contact(s)   Name Relation Home Work Elma, [name] Niece [phone]    Annabell Howells [phone]    Documents on File    Status Date Received Description  Documents for the Patient  [place] Received 04/19/18 Unable to sign  [place] E-Signature HIPAA Notice of Privacy Unable to Obtain 46/80/32   Driver's License Not Received    Insurance Card Not Received    Advance Directives/Living Will/HCPOA/POA Not Received    Other Photo ID Not Received    HIM ROI Authorization  05/26/17 [place] E-Signature HIPAA Notice of Privacy Received 04/03/18 unable due to condition  HIM Release of Information Output (Deleted) 05/26/17 Abstract of Health Information  Documents for the Encounter  AOB (Assignment of Insurance Benefits) Received 04/19/18 Unable to sign  E-signature AOB     MEDICARE RIGHTS Received 04/19/18 Unable to sign  E-signature Medicare Rights     ED Patient Billing Extract   ED PB Billing Extract  EKG Received 04/20/18   Admission Information   Current Information   Attending Provider Admitting Provider Admission Type Admission Status  Kathie Dike, MD Murlean Iba, MD Emergency Admission (Confirmed)       Admission Date/Time Discharge Date Hospital Service Auth/Cert Status  03/31/81 09:26 AM  [place] Unit Room/Bed   [facility] AP-DEPT 300 A314/A314-01        Admission   Complaint  altered mental status  Hospital Account   Name Acct ID Class Status Primary [place], [place] [id number]  Inpatient Open MEDICARE - MEDICARE PART A AND B      Guarantor Account (for [place] [id number])   Name Relation to Pt Service Area Active? Acct Type  [place], [place] Yes Personal/Family  Address Phone    [place] [address] [place], [place] [address] [phone])        Coverage Information (for Hospital Account [id number])   1. [place] PART A AND B   F/O Payor/Plan Precert #  MEDICARE/MEDICARE PART A AND B   Subscriber Subscriber #  [place], [place]  Address Phone  [address] [place], [place] [address]   2. MEDICAID [place]/MEDICAID [place] ACCESS   F/O Payor/Plan Precert #  MEDICAID [place]/MEDICAID [place] ACCESS   Subscriber Subscriber #  [place], [place] [id number]  Address Phone  [address] [place], [place] [address] [phone]       Care Everywhere ID:  [id number]

## 2018-04-23 NOTE — Discharge Summary (Signed)
Physician Discharge Summary  Adin Bayon MRN:[id number] DOB: 04/02/1929 DOA: 04/19/2018  PCP: Patient, No Pcp Per  Admit date: 04/19/2018 Discharge date: 04/23/2018  Admitted From: Assisted living Disposition: Assisted living with hospice services  Recommendations for Outpatient Follow-up:  1. Patient will return to assisted living facility with hospice services to follow for end-of-life care  Discharge Condition: Stable CODE STATUS: DNR Diet recommendation: Pured diet with thin liquids  Brief/Interim Summary: 85-year-old female with advanced dementia, reported paraplegia, chronic left heel ulcer, resident of [facility], was sent to the emergency room with increasing agitation, frequent falls, decreased oral intake and refusing to take medications.  She is found to have possible urinary tract infection dehydration and was admitted for further treatments.  Discharge Diagnoses:  Active Problems:   UTI due to extended-spectrum beta lactamase (ESBL) producing Escherichia coli   Leukocytosis   Anemia   Dementia (HCC)   CHF (congestive heart failure) (HCC)   Chronic ulcer of left heel (HCC)   Multiple myeloma (HCC)   Hypotension   Dehydration   Advanced care planning/counseling discussion   Goals of care, counseling/discussion   Palliative care by specialist   Acute cystitis without hematuria   Weakness  1. UTI secondary to ESBL E. coli.  Patient was treated in the hospital with intravenous meropenem.  She is completed 5 days of therapy.  She is afebrile.  She does not need any further treatment. 2. Advanced dementia with decreased p.o. intake/refusing to eat and drink and failure to thrive.  Seen by palliative care who met with the patient's niece.  Her niece has agreed to become the main decision maker for the patient since the patient does not have any other family.  She does agree to DNR status and agrees focusing on quality of life through hospice would be best for patient at  this point.  Patient will return to assisted living facility with hospice services to follow for end-of-life care. 3. Hypokalemia/hypomagnesemia.  Replaced. 4. Dehydration.  Improved with IV fluids. 5. Leukocytosis.  Resolved  Discharge Instructions  Discharge Instructions    Diet - low sodium heart healthy   Complete by:  As directed    Increase activity slowly   Complete by:  As directed      Allergies as of 04/23/2018   No Known Allergies     Medication List    STOP taking these medications   amoxicillin 500 MG capsule Commonly known as:  AMOXIL   atorvastatin 40 MG tablet Commonly known as:  LIPITOR   azithromycin 250 MG tablet Commonly known as:  ZITHROMAX   dexamethasone 4 MG tablet Commonly known as:  DECADRON   doxycycline 100 MG tablet Commonly known as:  ADOXA   furosemide 20 MG tablet Commonly known as:  LASIX   INTEGRA F 125-1 MG Caps   meclizine 25 MG tablet Commonly known as:  ANTIVERT   multivitamin-lutein Caps capsule   Vitamin D (Ergocalciferol) 1.25 MG (50000 UT) Caps capsule Commonly known as:  DRISDOL     TAKE these medications   albuterol 108 (90 Base) MCG/ACT inhaler Commonly known as:  PROVENTIL HFA;VENTOLIN HFA Inhale 2 puffs into the lungs every 6 (six) hours as needed for wheezing or shortness of breath.   diclofenac sodium 1 % Gel Commonly known as:  VOLTAREN Apply 2 g topically 4 (four) times daily.   divalproex 125 MG DR tablet Commonly known as:  DEPAKOTE Take 125 mg by mouth 2 (two) times daily.     gabapentin 300 MG capsule Commonly known as:  NEURONTIN Take 300 mg by mouth 3 (three) times daily.   LORazepam 0.5 MG tablet Commonly known as:  ATIVAN Take 0.5 mg by mouth every 8 (eight) hours. Take 1/2 tablet daily as need for anxiety   MINERIN Crea Apply 1 application topically daily as needed (to lower legs for dry skin).   sertraline 50 MG tablet Commonly known as:  ZOLOFT Take 50 mg by mouth daily.    traZODone 50 MG tablet Commonly known as:  DESYREL Take 50 mg by mouth at bedtime.   TYLENOL ARTHRITIS EXT RELIEF PO Take 650 mg by mouth daily as needed (for mild pain).   zinc oxide 20 % ointment Apply 1 application topically 3 (three) times daily as needed for irritation (applied to bilateral buttocks).       No Known Allergies  Consultations:  Palliative care   Procedures/Studies: Dg Chest 1 View  Result Date: 04/19/2018 CLINICAL DATA:  Weakness. EXAM: CHEST  1 VIEW COMPARISON:  Radiograph of January 21, 2018. FINDINGS: Stable cardiomegaly. Atherosclerosis of thoracic aorta is noted. Minimal right basilar subsegmental atelectasis is noted. Left lung is unremarkable. The visualized skeletal structures are unremarkable. IMPRESSION: Minimal right basilar subsegmental atelectasis. Aortic Atherosclerosis (ICD10-I70.0). Electronically Signed   By: Marijo Conception, M.D.   On: 04/19/2018 11:34   Ct Head Wo Contrast  Result Date: 04/19/2018 CLINICAL DATA:  Patient with agitation.  Altered mental status. EXAM: CT HEAD WITHOUT CONTRAST TECHNIQUE: Contiguous axial images were obtained from the base of the skull through the vertex without intravenous contrast. COMPARISON:  Brain CT 01/21/2018 FINDINGS: Brain: Ventricles and sulci are prominent compatible with atrophy. Periventricular and subcortical white matter hypodensities compatible with chronic microvascular ischemic changes. No evidence for acute cortically based infarct, intracranial hemorrhage, mass lesion or mass-effect. Vascular: Unremarkable Skull: Intact. Sinuses/Orbits: Paranasal sinuses are well aerated. Mastoid air cells are unremarkable. Other: None. IMPRESSION: No acute intracranial process. Atrophy and chronic microvascular ischemic changes. Electronically Signed   By: Lovey Newcomer M.D.   On: 04/19/2018 11:41   Mr Brain Wo Contrast  Result Date: 04/20/2018 CLINICAL DATA:  Altered mental status. EXAM: MRI HEAD WITHOUT CONTRAST  TECHNIQUE: Multiplanar, multiecho pulse sequences of the brain and surrounding structures were obtained without intravenous contrast. COMPARISON:  Head CT 04/19/2018 FINDINGS: The study is mildly to moderately motion degraded. Brain: There is no evidence of acute infarct, intracranial hemorrhage, mass, midline shift, or extra-axial fluid collection. There is moderate cerebral atrophy. Small chronic cortical infarcts are noted in the right frontal and right parietal lobes. Patchy T2 hyperintensities in the cerebral white matter bilaterally are nonspecific but compatible with moderate chronic small vessel ischemic disease. Vascular: Major intracranial vascular flow voids are preserved. Skull and upper cervical spine: No suspicious marrow lesion. Sinuses/Orbits: Bilateral cataract extraction. Paranasal sinuses and mastoid air cells are clear. Other: None. IMPRESSION: 1. Motion degraded examination. 2. No acute intracranial abnormality. 3. Moderate chronic small vessel ischemic disease and cerebral atrophy. Electronically Signed   By: Logan Bores M.D.   On: 04/20/2018 09:48   Dg Foot Complete Left  Result Date: 04/06/2018 CLINICAL DATA:  Purulence heel ulcer and worsening swelling EXAM: LEFT FOOT - COMPLETE 3+ VIEW COMPARISON:  04/03/2018 left foot radiographs FINDINGS: Moderate diffuse soft tissue swelling. Soft tissue ulcer in the left heel overlying the posterior left calcaneus. No fracture or dislocation. No acute osseous erosions. No suspicious focal osseous lesions. No periosteal reaction or radiopaque foreign bodies. IMPRESSION:  Soft tissue ulcer in the left heel overlying the posterior left calcaneus. Moderate diffuse soft tissue swelling. No specific radiographic findings of acute osteomyelitis. Electronically Signed   By: Ilona Sorrel M.D.   On: 04/06/2018 00:20   Dg Foot Complete Left  Result Date: 04/03/2018 CLINICAL DATA:  wound on her left foot with drainage and edema that has been worse the  last several days. The patient has an alt mental status at baseline but has become more agitated today. Pt was held for all images, agitated, fought against each view. EXAM: LEFT FOOT - COMPLETE 3+ VIEW COMPARISON:  It has 01/21/2018 FINDINGS: There is significant soft tissue swelling of the entire foot, particularly involving the forefoot. No radiopaque foreign body or soft tissue gas. Bones are radiolucent. No cortical erosion identified to indicate presence of osteomyelitis. IMPRESSION: Significant soft tissue swelling.  No acute osseous abnormality. Electronically Signed   By: Nolon Nations M.D.   On: 04/03/2018 18:48       Subjective: Confused, cannot participate in history  Discharge Exam: Vitals:   04/22/18 1600 04/22/18 2123 04/23/18 0453 04/23/18 1410  BP: (!) 89/58 106/71 119/70 109/67  Pulse: (!) 57 (!) 55 (!) 44 (!) 55  Resp:  _0 Temp: (!) 97.3 F (36.3 C) (!) 97.5 F (36.4 C)    TempSrc: Axillary Oral    SpO2: 100% 97% 100% 99%  Weight:   73.4 kg   Height:        General: Pt is alert, awake, not in acute distress Cardiovascular: RRR, S1/S2 +, no rubs, no gallops Respiratory: CTA bilaterally, no wheezing, no rhonchi Abdominal: Soft, NT, ND, bowel sounds + Extremities: no edema, no cyanosis    The results of significant diagnostics from this hospitalization (including imaging, microbiology, ancillary and laboratory) are listed below for reference.     Microbiology: Recent Results (from the past 240 hour(s))  Culture, blood (Routine X 2) w Reflex to ID Panel     Status: None (Preliminary result)   Collection Time: 04/19/18  9:47 AM  Result Value Ref Range Status   Specimen Description BLOOD RIGHT ARM  Final   Special Requests   Final    BOTTLES DRAWN AEROBIC AND ANAEROBIC Blood Culture results may not be optimal due to an excessive volume of blood received in culture bottles   Culture   Final    NO GROWTH 4 DAYS Performed at [facility], [address] [place], [place] [address]    Report Status PENDING  Incomplete  Urine Culture     Status: Abnormal   Collection Time: 04/19/18 10:21 AM  Result Value Ref Range Status   Specimen Description   Final    URINE, CATHETERIZED Performed at [facility], [address] [place] [address], [place], [place] [address]    Special Requests   Final    NONE Performed at [facility], [address], [place], [place] [address]    Culture (A)  Final    >=100,000 COLONIES/mL ESCHERICHIA COLI Confirmed Extended Spectrum Beta-Lactamase Producer (ESBL).  In bloodstream infections from ESBL organisms, carbapenems are preferred over piperacillin/tazobactam. They are shown to have a lower risk of mortality.    Report Status 04/21/2018 FINAL  Final   Organism ID, Bacteria ESCHERICHIA COLI (A)  Final      Susceptibility   Escherichia coli - MIC*    AMPICILLIN >=32 RESISTANT Resistant     CEFAZOLIN >=64 RESISTANT Resistant     CEFTRIAXONE >=64 RESISTANT Resistant  CIPROFLOXACIN >=4 RESISTANT Resistant     GENTAMICIN <=1 SENSITIVE Sensitive     IMIPENEM <=0.25 SENSITIVE Sensitive     NITROFURANTOIN <=16 SENSITIVE Sensitive     TRIMETH/SULFA >=320 RESISTANT Resistant     AMPICILLIN/SULBACTAM >=32 RESISTANT Resistant     PIP/TAZO 64 INTERMEDIATE Intermediate     Extended ESBL POSITIVE Resistant     * >=100,000 COLONIES/mL ESCHERICHIA COLI  Culture, blood (Routine X 2) w Reflex to ID Panel     Status: None (Preliminary result)   Collection Time: 04/19/18 10:30 AM  Result Value Ref Range Status   Specimen Description BLOOD LEFT ARM  Final   Special Requests   Final    BOTTLES DRAWN AEROBIC AND ANAEROBIC Blood Culture adequate volume   Culture   Final    NO GROWTH 4 DAYS Performed at [facility], [address], [place], [place] [address]    Report Status PENDING  Incomplete  MRSA PCR Screening     Status: None   Collection Time: 04/19/18  6:32 PM  Result Value Ref Range Status   MRSA by PCR NEGATIVE NEGATIVE  Final    Comment:        The GeneXpert MRSA Assay (FDA approved for NASAL specimens only), is one component of a comprehensive MRSA colonization surveillance program. It is not intended to diagnose MRSA infection nor to guide or monitor treatment for MRSA infections. Performed at [facility], [address], [place], [place] [address]      Labs: BNP (last 3 results) No results for input(s): BNP in the last 8760 hours. Basic Metabolic Panel: Recent Labs  Lab 04/19/18 0947 04/20/18 0608 04/21/18 0543 04/22/18 0608 04/23/18 0605  NA 141 143 143 143 141  K 4.2 4.0 3.8 3.3* 3.5  CL 106 111 113* 114* 114*  CO2 28 21* _0 GLUCOSE 107* 58* 96 91 87  BUN _1 7*  CREATININE 0.97 0.72 0.61 0.66 0.56  CALCIUM 8.9 8.2* 8.3* 7.7* 7.9*  MG  --  1.7 1.9 1.7  --    Liver Function Tests: Recent Labs  Lab 04/19/18 1030  AST 22  ALT 19  ALKPHOS 68  BILITOT 0.4  PROT 6.3*  ALBUMIN 2.8*   No results for input(s): LIPASE, AMYLASE in the last 168 hours. No results for input(s): AMMONIA in the last 168 hours. CBC: Recent Labs  Lab 04/19/18 0947 04/20/18 0608 04/21/18 0543 04/22/18 0608 04/23/18 0605  WBC 10.9* 9.4 9.7 8.1 7.5  NEUTROABS 6.7 5.4 5.7 3.3 2.7  HGB 10.4* 9.1* 10.5* 8.0* 8.4*  HCT 34.1* 29.9* 35.5* 26.4* 28.2*  MCV 82.4 84.0 85.3 83.0 85.2  PLT 218 206 205 162 175   Cardiac Enzymes: Recent Labs  Lab 04/19/18 1030  TROPONINI <0.03   BNP: Invalid input(s): POCBNP CBG: No results for input(s): GLUCAP in the last 168 hours. D-Dimer No results for input(s): DDIMER in the last 72 hours. Hgb A1c No results for input(s): HGBA1C in the last 72 hours. Lipid Profile No results for input(s): CHOL, HDL, LDLCALC, TRIG, CHOLHDL, LDLDIRECT in the last 72 hours. Thyroid function studies No results for input(s): TSH, T4TOTAL, T3FREE, THYROIDAB in the last 72 hours.  Invalid input(s): FREET3 Anemia work up No results for input(s): VITAMINB12, FOLATE,  FERRITIN, TIBC, IRON, RETICCTPCT in the last 72 hours. Urinalysis    Component Value Date/Time   COLORURINE YELLOW 04/19/2018 1021   APPEARANCEUR HAZY (A) 04/19/2018 1021   APPEARANCEUR Clear 07/17/2013  1200   LABSPEC 1.015 04/19/2018 1021   LABSPEC 1.018 07/17/2013 1200   PHURINE 6.0 04/19/2018 1021   GLUCOSEU NEGATIVE 04/19/2018 1021   GLUCOSEU Negative 07/17/2013 1200   HGBUR NEGATIVE 04/19/2018 1021   BILIRUBINUR NEGATIVE 04/19/2018 1021   BILIRUBINUR Negative 07/17/2013 1200   KETONESUR 20 (A) 04/19/2018 1021   PROTEINUR NEGATIVE 04/19/2018 1021   NITRITE POSITIVE (A) 04/19/2018 1021   LEUKOCYTESUR MODERATE (A) 04/19/2018 1021   LEUKOCYTESUR Negative 07/17/2013 1200   Sepsis Labs Invalid input(s): PROCALCITONIN,  WBC,  LACTICIDVEN Microbiology Recent Results (from the past 240 hour(s))  Culture, blood (Routine X 2) w Reflex to ID Panel     Status: None (Preliminary result)   Collection Time: 04/19/18  9:47 AM  Result Value Ref Range Status   Specimen Description BLOOD RIGHT ARM  Final   Special Requests   Final    BOTTLES DRAWN AEROBIC AND ANAEROBIC Blood Culture results may not be optimal due to an excessive volume of blood received in culture bottles   Culture   Final    NO GROWTH 4 DAYS Performed at [facility], [address], [place], [place] [address]    Report Status PENDING  Incomplete  Urine Culture     Status: Abnormal   Collection Time: 04/19/18 10:21 AM  Result Value Ref Range Status   Specimen Description   Final    URINE, CATHETERIZED Performed at [facility], [address], [place], [place] [address]    Special Requests   Final    NONE Performed at [facility], [address] [place], [place] [address]    Culture (A)  Final    >=100,000 COLONIES/mL ESCHERICHIA COLI Confirmed Extended Spectrum Beta-Lactamase Producer (ESBL).  In bloodstream infections from ESBL organisms, carbapenems are preferred over piperacillin/tazobactam. They are shown to  have a lower risk of mortality.    Report Status 04/21/2018 FINAL  Final   Organism ID, Bacteria ESCHERICHIA COLI (A)  Final      Susceptibility   Escherichia coli - MIC*    AMPICILLIN >=32 RESISTANT Resistant     CEFAZOLIN >=64 RESISTANT Resistant     CEFTRIAXONE >=64 RESISTANT Resistant     CIPROFLOXACIN >=4 RESISTANT Resistant     GENTAMICIN <=1 SENSITIVE Sensitive     IMIPENEM <=0.25 SENSITIVE Sensitive     NITROFURANTOIN <=16 SENSITIVE Sensitive     TRIMETH/SULFA >=320 RESISTANT Resistant     AMPICILLIN/SULBACTAM >=32 RESISTANT Resistant     PIP/TAZO 64 INTERMEDIATE Intermediate     Extended ESBL POSITIVE Resistant     * >=100,000 COLONIES/mL ESCHERICHIA COLI  Culture, blood (Routine X 2) w Reflex to ID Panel     Status: None (Preliminary result)   Collection Time: 04/19/18 10:30 AM  Result Value Ref Range Status   Specimen Description BLOOD LEFT ARM  Final   Special Requests   Final    BOTTLES DRAWN AEROBIC AND ANAEROBIC Blood Culture adequate volume   Culture   Final    NO GROWTH 4 DAYS Performed at [facility], [address] [place], [place] [address]    Report Status PENDING  Incomplete  MRSA PCR Screening     Status: None   Collection Time: 04/19/18  6:32 PM  Result Value Ref Range Status   MRSA by PCR NEGATIVE NEGATIVE Final    Comment:        The GeneXpert MRSA Assay (FDA approved for NASAL specimens only), is one component of a comprehensive MRSA colonization surveillance  program. It is not intended to diagnose MRSA infection nor to guide or monitor treatment for MRSA infections. Performed at [facility], [address] [place], [place] [address]      Time coordinating discharge: 35mins  SIGNED:   Jehanzeb Memon, MD  [facility] 04/23/2018, 2:13 PM   If 7PM-7AM, please contact night-coverage [email or web address]  

## 2018-04-23 NOTE — Care Management Important Message (Signed)
Important Message  Patient Details  Name: Carmen Christian MRN: [id number] Date of Birth: 10/28/1928   Medicare Important Message Given:  Yes    Sherald Barge, RN 04/23/2018, 12:22 PM

## 2018-04-23 NOTE — Progress Notes (Addendum)
Palliative Medicine Note  I called and spoke with patient's niece Carmen Christian this morning.  She agrees to be patient's surrogate Research scientist (physical sciences).  She expressed deep concern and left for her [place].  I discussed the trajectory of dementia with Ms. Carmen Christian.  I expressed concern that [place] was heading into end-stage dementia given her symptoms of decreased p.o. intake and verbalizations.  Hospice follow-up at her assisted living facility at discharge was recommended and Ms. Carmen Christian agrees that this would be in patient's best interest.   Non face to face encounter:   Total time: 30 minutes Time on: 0930  Time off: 1000   Greater than 50%  of this time was spent counseling and coordinating care related to the above assessment and plan    Mariana Kaufman, AGNP-C Palliative Medicine  Please call Palliative Medicine team phone with any questions [phone]. For individual providers please see AMION.

## 2018-04-24 LAB — CULTURE, BLOOD (ROUTINE X 2)
CULTURE: NO GROWTH
Culture: NO GROWTH
Special Requests: ADEQUATE

## 2018-05-23 ENCOUNTER — Emergency Department (HOSPITAL_COMMUNITY): Payer: Medicare Other

## 2018-05-23 ENCOUNTER — Inpatient Hospital Stay (HOSPITAL_COMMUNITY)
Admission: EM | Admit: 2018-05-23 | Discharge: 2018-05-30 | DRG: 698 | Disposition: A | Discharge status: Skilled Nursing Facility | Payer: Medicare Other | Attending: Family Medicine | Admitting: Family Medicine

## 2018-05-23 ENCOUNTER — Other Ambulatory Visit: Payer: Self-pay

## 2018-05-23 DIAGNOSIS — Z79899 Other long term (current) drug therapy: Secondary | ICD-10-CM

## 2018-05-23 DIAGNOSIS — F0391 Unspecified dementia with behavioral disturbance: Secondary | ICD-10-CM | POA: Diagnosis not present

## 2018-05-23 DIAGNOSIS — G92 Toxic encephalopathy: Secondary | ICD-10-CM | POA: Diagnosis present

## 2018-05-23 DIAGNOSIS — B9629 Other Escherichia coli [E. coli] as the cause of diseases classified elsewhere: Secondary | ICD-10-CM | POA: Diagnosis present

## 2018-05-23 DIAGNOSIS — N390 Urinary tract infection, site not specified: Secondary | ICD-10-CM

## 2018-05-23 DIAGNOSIS — Z515 Encounter for palliative care: Secondary | ICD-10-CM | POA: Diagnosis present

## 2018-05-23 DIAGNOSIS — G8220 Paraplegia, unspecified: Secondary | ICD-10-CM | POA: Diagnosis present

## 2018-05-23 DIAGNOSIS — T68XXXA Hypothermia, initial encounter: Secondary | ICD-10-CM | POA: Diagnosis present

## 2018-05-23 DIAGNOSIS — Z7189 Other specified counseling: Secondary | ICD-10-CM | POA: Diagnosis not present

## 2018-05-23 DIAGNOSIS — A419 Sepsis, unspecified organism: Secondary | ICD-10-CM

## 2018-05-23 DIAGNOSIS — L97429 Non-pressure chronic ulcer of left heel and midfoot with unspecified severity: Secondary | ICD-10-CM | POA: Diagnosis present

## 2018-05-23 DIAGNOSIS — X58XXXA Exposure to other specified factors, initial encounter: Secondary | ICD-10-CM | POA: Diagnosis present

## 2018-05-23 DIAGNOSIS — C9000 Multiple myeloma not having achieved remission: Secondary | ICD-10-CM | POA: Diagnosis present

## 2018-05-23 DIAGNOSIS — F419 Anxiety disorder, unspecified: Secondary | ICD-10-CM | POA: Diagnosis present

## 2018-05-23 DIAGNOSIS — B9620 Unspecified Escherichia coli [E. coli] as the cause of diseases classified elsewhere: Secondary | ICD-10-CM | POA: Diagnosis present

## 2018-05-23 DIAGNOSIS — I959 Hypotension, unspecified: Secondary | ICD-10-CM | POA: Diagnosis present

## 2018-05-23 DIAGNOSIS — Z1612 Extended spectrum beta lactamase (ESBL) resistance: Secondary | ICD-10-CM | POA: Diagnosis present

## 2018-05-23 DIAGNOSIS — T83518A Infection and inflammatory reaction due to other urinary catheter, initial encounter: Principal | ICD-10-CM | POA: Diagnosis present

## 2018-05-23 DIAGNOSIS — R6520 Severe sepsis without septic shock: Secondary | ICD-10-CM

## 2018-05-23 DIAGNOSIS — Z66 Do not resuscitate: Secondary | ICD-10-CM | POA: Diagnosis present

## 2018-05-23 DIAGNOSIS — A4151 Sepsis due to Escherichia coli [E. coli]: Secondary | ICD-10-CM | POA: Diagnosis present

## 2018-05-23 DIAGNOSIS — G309 Alzheimer's disease, unspecified: Secondary | ICD-10-CM | POA: Diagnosis not present

## 2018-05-23 DIAGNOSIS — F329 Major depressive disorder, single episode, unspecified: Secondary | ICD-10-CM | POA: Diagnosis present

## 2018-05-23 DIAGNOSIS — F0390 Unspecified dementia without behavioral disturbance: Secondary | ICD-10-CM | POA: Diagnosis not present

## 2018-05-23 DIAGNOSIS — R1310 Dysphagia, unspecified: Secondary | ICD-10-CM | POA: Diagnosis present

## 2018-05-23 DIAGNOSIS — I509 Heart failure, unspecified: Secondary | ICD-10-CM | POA: Diagnosis present

## 2018-05-23 DIAGNOSIS — D649 Anemia, unspecified: Secondary | ICD-10-CM | POA: Diagnosis present

## 2018-05-23 DIAGNOSIS — G9340 Encephalopathy, unspecified: Secondary | ICD-10-CM | POA: Diagnosis not present

## 2018-05-23 DIAGNOSIS — F0281 Dementia in other diseases classified elsewhere with behavioral disturbance: Secondary | ICD-10-CM | POA: Diagnosis present

## 2018-05-23 LAB — URINALYSIS, ROUTINE W REFLEX MICROSCOPIC
Bilirubin Urine: NEGATIVE
Glucose, UA: NEGATIVE mg/dL
Ketones, ur: NEGATIVE mg/dL
Nitrite: NEGATIVE
Protein, ur: NEGATIVE mg/dL
RBC / HPF: >50 RBC/hpf — ABNORMAL HIGH (ref 0–5)
Specific Gravity, Urine: 1.006 (ref 1.005–1.030)
WBC, UA: >50 WBC/hpf — ABNORMAL HIGH (ref 0–5)
pH: 5 (ref 5.0–8.0)

## 2018-05-23 LAB — LACTIC ACID, PLASMA
Lactic Acid, Venous: 0.7 mmol/L (ref 0.5–1.9)
Lactic Acid, Venous: 1.1 mmol/L (ref 0.5–1.9)

## 2018-05-23 LAB — CBC WITH DIFFERENTIAL/PLATELET
Abs Immature Granulocytes: 0.02 10*3/uL (ref 0.00–0.07)
Basophils Absolute: 0 10*3/uL (ref 0.0–0.1)
Basophils Relative: 0 %
Eosinophils Absolute: 0.2 10*3/uL (ref 0.0–0.5)
Eosinophils Relative: 3 %
HCT: 32.7 % — ABNORMAL LOW (ref 36.0–46.0)
Hemoglobin: 9.6 g/dL — ABNORMAL LOW (ref 12.0–15.0)
IMMATURE GRANULOCYTES: 0 %
Lymphocytes Relative: 43 %
Lymphs Abs: 2.9 10*3/uL (ref 0.7–4.0)
MCH: 24.9 pg — ABNORMAL LOW (ref 26.0–34.0)
MCHC: 29.4 g/dL — ABNORMAL LOW (ref 30.0–36.0)
MCV: 84.7 fL (ref 80.0–100.0)
Monocytes Absolute: 0.2 10*3/uL (ref 0.1–1.0)
Monocytes Relative: 4 %
NEUTROS PCT: 50 %
Neutro Abs: 3.4 10*3/uL (ref 1.7–7.7)
Platelets: 189 10*3/uL (ref 150–400)
RBC: 3.86 MIL/uL — ABNORMAL LOW (ref 3.87–5.11)
RDW: 19.6 % — ABNORMAL HIGH (ref 11.5–15.5)
WBC: 6.8 10*3/uL (ref 4.0–10.5)
nRBC: 0 % (ref 0.0–0.2)

## 2018-05-23 LAB — COMPREHENSIVE METABOLIC PANEL
ALT: 17 U/L (ref 0–44)
AST: 21 U/L (ref 15–41)
Albumin: 2.6 g/dL — ABNORMAL LOW (ref 3.5–5.0)
Alkaline Phosphatase: 68 U/L (ref 38–126)
Anion gap: 8 (ref 5–15)
BUN: 16 mg/dL (ref 8–23)
CHLORIDE: 102 mmol/L (ref 98–111)
CO2: 33 mmol/L — ABNORMAL HIGH (ref 22–32)
Calcium: 8.8 mg/dL — ABNORMAL LOW (ref 8.9–10.3)
Creatinine, Ser: 0.81 mg/dL (ref 0.44–1.00)
GFR calc Af Amer: >60 mL/min (ref >60)
GFR calc non Af Amer: >60 mL/min (ref >60)
GLUCOSE: 65 mg/dL — AB (ref 70–99)
Potassium: 3.8 mmol/L (ref 3.5–5.1)
SODIUM: 143 mmol/L (ref 135–145)
Total Bilirubin: 0.4 mg/dL (ref 0.3–1.2)
Total Protein: 5.9 g/dL — ABNORMAL LOW (ref 6.5–8.1)

## 2018-05-23 LAB — CBG MONITORING, ED: Glucose-Capillary: 103 mg/dL — ABNORMAL HIGH (ref 70–99)

## 2018-05-23 LAB — PROTIME-INR
INR: 0.93
Prothrombin Time: 12.4 seconds (ref 11.4–15.2)

## 2018-05-23 LAB — TSH: TSH: 5.986 u[IU]/mL — AB (ref 0.350–4.500)

## 2018-05-23 LAB — VALPROIC ACID LEVEL: Valproic Acid Lvl: 44 ug/mL — ABNORMAL LOW (ref 50.0–100.0)

## 2018-05-23 LAB — POC OCCULT BLOOD, ED: Fecal Occult Bld: POSITIVE — AB

## 2018-05-23 MED ORDER — SODIUM CHLORIDE 0.9 % IV SOLN
1.0000 g | Freq: Two times a day (BID) | INTRAVENOUS | Status: AC
  Administered 2018-05-24 – 2018-05-28 (×10): 1 g via INTRAVENOUS
  Filled 2018-05-23 (×12): qty 1

## 2018-05-23 MED ORDER — SODIUM CHLORIDE 0.9 % IV BOLUS
1000.0000 mL | Freq: Once | INTRAVENOUS | Status: AC
  Administered 2018-05-23: 1000 mL via INTRAVENOUS

## 2018-05-23 MED ORDER — DEXTROSE 50 % IV SOLN
INTRAVENOUS | Status: AC
  Filled 2018-05-23: qty 50

## 2018-05-23 MED ORDER — ONDANSETRON HCL 4 MG/2ML IJ SOLN: 4.0000 mg | Freq: Four times a day (QID) | INTRAMUSCULAR | Status: DC | PRN

## 2018-05-23 MED ORDER — ACETAMINOPHEN 650 MG RE SUPP: 650.0000 mg | Freq: Four times a day (QID) | RECTAL | Status: DC | PRN

## 2018-05-23 MED ORDER — ENOXAPARIN SODIUM 40 MG/0.4ML ~~LOC~~ SOLN
40.0000 mg | SUBCUTANEOUS | Status: DC
  Administered 2018-05-23 – 2018-05-29 (×6): 40 mg via SUBCUTANEOUS
  Filled 2018-05-23 (×6): qty 0.4

## 2018-05-23 MED ORDER — DEXTROSE-NACL 5-0.45 % IV SOLN
INTRAVENOUS | Status: DC
  Administered 2018-05-23 – 2018-05-27 (×5): via INTRAVENOUS

## 2018-05-23 MED ORDER — SODIUM CHLORIDE 0.9 % IV SOLN
2.0000 g | Freq: Once | INTRAVENOUS | Status: AC
  Administered 2018-05-23: 2 g via INTRAVENOUS
  Filled 2018-05-23: qty 2

## 2018-05-23 MED ORDER — ONDANSETRON HCL 4 MG PO TABS: 4.0000 mg | ORAL_TABLET | Freq: Four times a day (QID) | ORAL | Status: DC | PRN

## 2018-05-23 MED ORDER — ACETAMINOPHEN 325 MG PO TABS: 650.0000 mg | ORAL_TABLET | Freq: Four times a day (QID) | ORAL | Status: DC | PRN

## 2018-05-23 MED ORDER — DEXTROSE 50 % IV SOLN
25.0000 mL | Freq: Once | INTRAVENOUS | Status: AC
  Administered 2018-05-23: 25 mL via INTRAVENOUS

## 2018-05-23 NOTE — Progress Notes (Signed)
Pharmacy Antibiotic Note  Carmen Christian is a 85 y.o. female admitted on 05/23/2018 with ESBL UTI.  Pharmacy has been consulted for meropenem dosing.  Plan: Meropenem 1000 mg IV every 12 hours  Monitor labs, c/s, and patient improvement  Weight: 161 lb (73 kg)  Temp (24hrs), Avg:91.5 F (33.1 C), Min:89.4 F (31.9 C), Max:94.5 F (34.7 C)  Recent Labs  Lab 05/23/18 0412 05/23/18 0602  WBC 6.8  --   CREATININE 0.81  --   LATICACIDVEN 0.7 1.1    Estimated Creatinine Clearance: 46.8 mL/min (by C-G formula based on SCr of 0.81 mg/dL).    No Known Allergies  Antimicrobials this admission: Merrem 2/15 >>      Dose adjustments this admission: [place]  Microbiology results: 2/15 BCx: pending 2/15 UCx: pending    Thank you for allowing pharmacy to be a part of this patient's care.  Ramond Craver 05/23/2018 9:54 AM

## 2018-05-23 NOTE — ED Notes (Signed)
[facility]:  Richrd Prime (nurse): [phone]

## 2018-05-23 NOTE — Progress Notes (Signed)
ANTIBIOTIC CONSULT NOTE-Preliminary  Pharmacy Consult for meropenem Indication: ESBL UTI  No Known Allergies  Patient Measurements:   Adjusted Body Weight:   Vital Signs: Temp: 90.5 F (32.5 C) (02/15 0630) BP: 86/53 (02/15 0630) Pulse Rate: 53 (02/15 0630)  Labs: Recent Labs    05/23/18 0412  WBC 6.8  HGB 9.6*  PLT 189  CREATININE 0.81    CrCl cannot be calculated (Unknown ideal weight.).  No results for input(s): VANCOTROUGH, VANCOPEAK, VANCORANDOM, GENTTROUGH, GENTPEAK, GENTRANDOM, TOBRATROUGH, TOBRAPEAK, TOBRARND, AMIKACINPEAK, AMIKACINTROU, AMIKACIN in the last 72 hours.   Microbiology: Recent Results (from the past 720 hour(s))  Blood culture (routine x 2)     Status: None (Preliminary result)   Collection Time: 05/23/18  4:12 AM  Result Value Ref Range Status   Specimen Description RIGHT ANTECUBITAL  Final   Special Requests   Final    BOTTLES DRAWN AEROBIC AND ANAEROBIC Blood Culture adequate volume Performed at [facility], [address], [place], [place] [address]    Culture PENDING  Incomplete   Report Status PENDING  Incomplete  Blood culture (routine x 2)     Status: None (Preliminary result)   Collection Time: 05/23/18  4:12 AM  Result Value Ref Range Status   Specimen Description LEFT ANTECUBITAL  Final   Special Requests   Final    BOTTLES DRAWN AEROBIC AND ANAEROBIC Blood Culture adequate volume Performed at [facility], [address] [place], [place] [address]    Culture PENDING  Incomplete   Report Status PENDING  Incomplete    Medical History: Past Medical History:  Diagnosis Date  . Anemia   . CHF (congestive heart failure) ([place])   . Dementia ([place])   . Depression   . Dizziness   . High cholesterol   . Hypercholesterolemia   . Multiple myeloma ([place])   . Renal disorder   . Vitamin D deficiency     Medications:  Scheduled:  Infusions:  Anti-infectives (From admission, onward)   Start     Dose/Rate Route Frequency Ordered  Stop   05/23/18 0515  meropenem (MERREM) 2 g in sodium chloride 0.9 % 100 mL IVPB     2 g 200 mL/hr over 30 Minutes Intravenous  Once 05/23/18 0511 05/23/18 0640      Assessment: 85 yo female from nursing facility with AMS, dementia, on hospice.  Recently admitted with ESBL UTI, starting meropenem for presumed continued infection.   Plan:  Preliminary review of pertinent patient information completed.  Protocol will be initiated with dose(s) of meropenem 2 grams x 1.  Forestine Na clinical pharmacist will complete review during morning rounds to assess patient and finalize treatment regimen if needed.  Nyra Capes, [facility] 05/23/2018,7:00 AM

## 2018-05-23 NOTE — H&P (Signed)
History and Physical    Carmen Christian LJQ:[id number] DOB: Feb 12, 1929 DOA: 05/23/2018  PCP: Patient, No Pcp Per   Patient coming from: SNF  Chief Complaint: AMS  HPI: Carmen Christian is a 85 y.o. female with medical history significant for anemia, reported paraplegia, chronic left heel ulcer, dyslipidemia, and severe dementia who is brought from her skilled nursing facility for acutely worsening confusion and some behavioral disturbances over the last 1 day which is superimposed on her baseline severe dementia.  She is reportedly under hospice care at this facility.  Patient is essentially noncommunicative due to dementia and is otherwise unable to give any other history.  No reported fevers or chills otherwise noted.   ED Course: Vital signs demonstrate hypothermia with temperature 90.5 F and hypotension with a blood pressure 86/53.  She arrived with a Foley catheter in place that appears to be contaminated and was recently treated for a UTI with ESBL and discharged after 5 days of treatment on meropenem on 1/16.  She has been started on some IV fluid and given meropenem empirically with urine cultures pending.  Chest x-ray with no significant findings and CT head with stable atrophy and no acute findings noted.  Laboratory data otherwise unremarkable.  EDP has spoken with niece who is apparently power of attorney and is agreeable to fluid and antibiotic administration and conservative measures.  She has been placed on a Bair hugger due to hypothermia.  Review of Systems: Cannot be obtained due to patient condition.  Past Medical History:  Diagnosis Date  . Anemia   . CHF (congestive heart failure) ([place])   . Dementia ([place])   . Depression   . Dizziness   . High cholesterol   . Hypercholesterolemia   . Multiple myeloma ([place])   . Renal disorder   . Vitamin D deficiency     No past surgical history on file.   has an unknown smoking status. She has never used smokeless tobacco. She reports  that she does not drink alcohol or use drugs.  No Known Allergies  No family history on file.  Prior to Admission medications   Medication Sig Start Date End Date Taking? Authorizing Provider  Acetaminophen (TYLENOL ARTHRITIS EXT RELIEF PO) Take 650 mg by mouth daily as needed (for mild pain).    [provider]  albuterol (PROVENTIL HFA;VENTOLIN HFA) 108 (90 Base) MCG/ACT inhaler Inhale 2 puffs into the lungs every 6 (six) hours as needed for wheezing or shortness of breath.    [provider]  diclofenac sodium (VOLTAREN) 1 % GEL Apply 2 g topically 4 (four) times daily.    [provider]  divalproex (DEPAKOTE) 125 MG DR tablet Take 125 mg by mouth 2 (two) times daily.     [provider]  gabapentin (NEURONTIN) 300 MG capsule Take 300 mg by mouth 3 (three) times daily.    [provider]  LORazepam (ATIVAN) 0.5 MG tablet Take 0.5 mg by mouth every 8 (eight) hours. Take 1/2 tablet daily as need for anxiety    [provider]  sertraline (ZOLOFT) 50 MG tablet Take 50 mg by mouth daily.     [provider]  Skin Protectants, Misc. (MINERIN) CREA Apply 1 application topically daily as needed (to lower legs for dry skin).    [provider]  traZODone (DESYREL) 50 MG tablet Take 50 mg by mouth at bedtime.    [provider]  zinc oxide 20 % ointment Apply 1 application topically  3 (three) times daily as needed for irritation (applied to bilateral buttocks).    [provider]    Physical Exam: Vitals:   05/23/18 0530 05/23/18 0600 05/23/18 0630 05/23/18 0700  BP: 93/63 91/67 (!) 86/53 (!) 87/64  Pulse: (!) 57 (!) 55 (!) 53 (!) 57  Resp: (!) _0 Temp: (!) 89.6 F (32 C) (!) 90.1 F (32.3 C) (!) 90.5 F (32.5 C) (!) 91.2 F (32.9 C)  TempSrc:    Core  SpO2: 96% 98% 98% 95%    Constitutional: NAD, noncommunicative, frail-appearing Vitals:   05/23/18 0530 05/23/18 0600 05/23/18 0630  05/23/18 0700  BP: 93/63 91/67 (!) 86/53 (!) 87/64  Pulse: (!) 57 (!) 55 (!) 53 (!) 57  Resp: (!) _1 Temp: (!) 89.6 F (32 C) (!) 90.1 F (32.3 C) (!) 90.5 F (32.5 C) (!) 91.2 F (32.9 C)  TempSrc:    Core  SpO2: 96% 98% 98% 95%   Eyes: lids and conjunctivae normal ENMT: Mucous membranes are dry, edentulous. Neck: normal, supple Respiratory: clear to auscultation bilaterally. Normal respiratory effort. No accessory muscle use.  Currently on 3 L nasal cannula. Cardiovascular: Regular rate and rhythm, no murmurs. No extremity edema. Abdomen: no tenderness, no distention. Bowel sounds positive.  Musculoskeletal:  No joint deformity upper and lower extremities.   Skin: no rashes, lesions, ulcers.  Psychiatric: Cannot be assessed due to patient condition.  Labs on Admission: I have personally reviewed following labs and imaging studies  CBC: Recent Labs  Lab 05/23/18 0412  WBC 6.8  NEUTROABS 3.4  HGB 9.6*  HCT 32.7*  MCV 84.7  PLT 223   Basic Metabolic Panel: Recent Labs  Lab 05/23/18 0412  NA 143  K 3.8  CL 102  CO2 33*  GLUCOSE 65*  BUN 16  CREATININE 0.81  CALCIUM 8.8*   GFR: CrCl cannot be calculated (Unknown ideal weight.). Liver Function Tests: Recent Labs  Lab 05/23/18 0412  AST 21  ALT 17  ALKPHOS 68  BILITOT 0.4  PROT 5.9*  ALBUMIN 2.6*   No results for input(s): LIPASE, AMYLASE in the last 168 hours. No results for input(s): AMMONIA in the last 168 hours. Coagulation Profile: Recent Labs  Lab 05/23/18 0412  INR 0.93   Cardiac Enzymes: No results for input(s): CKTOTAL, CKMB, CKMBINDEX, TROPONINI in the last 168 hours. BNP (last 3 results) No results for input(s): PROBNP in the last 8760 hours. HbA1C: No results for input(s): HGBA1C in the last 72 hours. CBG: Recent Labs  Lab 05/23/18 0559  GLUCAP 103*   Lipid Profile: No results for input(s): CHOL, HDL, LDLCALC, TRIG, CHOLHDL, LDLDIRECT in the last 72 hours. Thyroid  Function Tests: Recent Labs    05/23/18 0412  TSH 5.986*   Anemia Panel: No results for input(s): VITAMINB12, FOLATE, FERRITIN, TIBC, IRON, RETICCTPCT in the last 72 hours. Urine analysis:    Component Value Date/Time   COLORURINE YELLOW 05/23/2018 0434   APPEARANCEUR CLOUDY (A) 05/23/2018 0434   APPEARANCEUR Clear 07/17/2013 1200   LABSPEC 1.006 05/23/2018 0434   LABSPEC 1.018 07/17/2013 1200   PHURINE 5.0 05/23/2018 0434   GLUCOSEU NEGATIVE 05/23/2018 0434   GLUCOSEU Negative 07/17/2013 1200   HGBUR MODERATE (A) 05/23/2018 [place] NEGATIVE 05/23/2018 0434   BILIRUBINUR Negative 07/17/2013 [place] 05/23/2018 0434   PROTEINUR NEGATIVE 05/23/2018 0434   NITRITE NEGATIVE 05/23/2018 0434   LEUKOCYTESUR LARGE (A) 05/23/2018 0434  LEUKOCYTESUR Negative 07/17/2013 1200    Radiological Exams on Admission: Ct Head Wo Contrast  Result Date: 05/23/2018 CLINICAL DATA:  Altered mental status EXAM: CT HEAD WITHOUT CONTRAST TECHNIQUE: Contiguous axial images were obtained from the base of the skull through the vertex without intravenous contrast. COMPARISON:  Head CT April 19, 2018 and brain MRI April 20, 2018 FINDINGS: Brain: Moderate diffuse atrophy is stable. There is no intracranial mass, hemorrhage, extra-axial fluid collection, or midline shift. There is patchy small vessel disease in the centra semiovale bilaterally. No acute infarct is demonstrable. Vascular: No hyperdense vessel. There is calcification in each carotid siphon region. Skull: The bony calvarium appears intact. Sinuses/Orbits: There is mucosal thickening in several ethmoid air cells. Other visualized paranasal sinuses are clear. Visualized orbits appear symmetric bilaterally. Other: Mastoid air cells are clear. IMPRESSION: Stable atrophy with periventricular small vessel disease. No acute infarct. No mass or hemorrhage. There are foci of arterial vascular calcification. There is mucosal  thickening in several ethmoid air cells. Electronically Signed   By: Lowella Grip III M.D.   On: 05/23/2018 05:05   Dg Chest Portable 1 View  Result Date: 05/23/2018 CLINICAL DATA:  Altered mental status EXAM: PORTABLE CHEST 1 VIEW COMPARISON:  April 19, 2018 FINDINGS: There is scarring in the right base region. There is no edema or consolidation. Heart is upper normal in size with pulmonary vascularity normal. There is aortic atherosclerosis. There is advanced arthropathy in both shoulders. IMPRESSION: Scarring right base. No edema or consolidation. Stable cardiac silhouette. Advanced arthropathy in both shoulders, stable. Aortic Atherosclerosis (ICD10-I70.0). Electronically Signed   By: Lowella Grip III M.D.   On: 05/23/2018 05:03    EKG: Independently reviewed. 52bpm with artifacts.  Assessment/Plan Active Problems:   UTI due to extended-spectrum beta lactamase (ESBL) producing Escherichia coli   Anemia   Dementia (HCC)   Hypotension   Acute encephalopathy    1. Acute metabolic encephalopathy superimposed on advanced dementia secondary to UTI presumed ESBL.  Urine culture pending at this time, but will continue empiric treatment on Merrem as previous.  Patient did have Foley catheter which will be exchanged.  Will place on some IV fluid as well and monitor closely.  Avoid sedating agents. 2. Advanced dementia.  Will focus on comfort measures for any respiratory distress or anxiety.  Swallow evaluation prior to diet initiation.  Avoid aggressive measures. 3. Hypotension.  Maintain on IV fluid and monitor. 4. Anemia.  Currently appears to be stable.  No overt bleeding noted.  We will plan to repeat labs in a.m.  DVT prophylaxis: Lovenox Code Status: DNR Family Communication: EDP spoke with niece; none at bedside currently Disposition Plan:Admit for treatment of UTI and encephalopathy Consults called:None Admission status: Inpatient, MedSurg    Darleen Crocker DO [facility] Pager [phone]  If 7PM-7AM, please contact night-coverage [email or web address] Password [facility]  05/23/2018, 8:17 AM

## 2018-05-23 NOTE — ED Provider Notes (Signed)
[name] EMERGENCY DEPARTMENT Provider Note   CSN: [id number] Arrival date & time: 05/23/18  0345     History   Chief Complaint Chief Complaint  Patient presents with  . Altered Mental Status    HPI Carmen Christian is a 85 y.o. female.  Level 5 caveat for dementia and AMS.  Patient from nursing facility with change in mental status.  This is been ongoing for least the past day per staff.  Patient noncommunicative due to dementia and not able to give any history.  No reported fever.  Patient recently admitted for ESBL E. coli UTI. She arrives with a Foley catheter in place that appears to be contaminated.  The history is provided by the EMS personnel and the nursing home. The history is limited by the condition of the patient.  Altered Mental Status    Past Medical History:  Diagnosis Date  . Anemia   . CHF (congestive heart failure) ([place])   . Dementia ([place])   . Depression   . Dizziness   . High cholesterol   . Hypercholesterolemia   . Multiple myeloma ([place])   . Renal disorder   . Vitamin D deficiency     Patient Active Problem List   Diagnosis Date Noted  . Advanced care planning/counseling discussion   . Goals of care, counseling/discussion   . Palliative care by specialist   . Acute cystitis without hematuria   . Weakness   . UTI due to extended-spectrum beta lactamase (ESBL) producing Escherichia coli 04/19/2018  . Leukocytosis 04/19/2018  . Anemia 04/19/2018  . Dementia ([place]) 04/19/2018  . CHF (congestive heart failure) ([place]) 04/19/2018  . Chronic ulcer of left heel ([place]) 04/19/2018  . Multiple myeloma ([place]) 04/19/2018  . Hypotension 04/19/2018  . Dehydration 04/19/2018    No past surgical history on file.   OB History   No obstetric history on file.      Home Medications    Prior to Admission medications   Medication Sig Start Date End Date Taking? Authorizing Provider  Acetaminophen (TYLENOL ARTHRITIS EXT RELIEF PO) Take 650 mg by mouth  daily as needed (for mild pain).    [provider]  albuterol (PROVENTIL HFA;VENTOLIN HFA) 108 (90 Base) MCG/ACT inhaler Inhale 2 puffs into the lungs every 6 (six) hours as needed for wheezing or shortness of breath.    [provider]  diclofenac sodium (VOLTAREN) 1 % GEL Apply 2 g topically 4 (four) times daily.    [provider]  divalproex (DEPAKOTE) 125 MG DR tablet Take 125 mg by mouth 2 (two) times daily.     [provider]  gabapentin (NEURONTIN) 300 MG capsule Take 300 mg by mouth 3 (three) times daily.    [provider]  LORazepam (ATIVAN) 0.5 MG tablet Take 0.5 mg by mouth every 8 (eight) hours. Take 1/2 tablet daily as need for anxiety    [provider]  sertraline (ZOLOFT) 50 MG tablet Take 50 mg by mouth daily.     [provider]  Skin Protectants, Misc. (MINERIN) CREA Apply 1 application topically daily as needed (to lower legs for dry skin).    [provider]  traZODone (DESYREL) 50 MG tablet Take 50 mg by mouth at bedtime.    [provider]  zinc oxide 20 % ointment Apply 1 application topically 3 (three) times daily as needed for irritation (applied to bilateral buttocks).    [provider]    Family History No  family history on file.  Social History Social History   Tobacco Use  . Smoking status: Unknown If Ever Smoked  . Smokeless tobacco: Never Used  Substance Use Topics  . Alcohol use: No    Frequency: Never  . Drug use: Never     Allergies   Patient has no known allergies.   Review of Systems Review of Systems  Unable to perform ROS: Dementia     Physical Exam Updated Vital Signs BP 111/76 (BP Location: Left Arm)   Pulse (!) 52   Resp 18   SpO2 99%   Physical Exam Vitals signs and nursing note reviewed.  Constitutional:      General: She is not in acute distress.    Appearance: She is well-developed. She is ill-appearing.     Comments:  Chronically ill-appearing, contracted, resting with eyes closed  HENT:     Head: Normocephalic and atraumatic.     Mouth/Throat:     Mouth: Mucous membranes are dry.     Pharynx: No oropharyngeal exudate.  Eyes:     Conjunctiva/sclera: Conjunctivae normal.     Pupils: Pupils are equal, round, and reactive to light.  Neck:     Musculoskeletal: Normal range of motion and neck supple.     Comments: No meningismus. Cardiovascular:     Rate and Rhythm: Regular rhythm. Bradycardia present.     Heart sounds: Normal heart sounds. No murmur.  Pulmonary:     Effort: Pulmonary effort is normal. No respiratory distress.     Breath sounds: Normal breath sounds.  Abdominal:     Palpations: Abdomen is soft.     Tenderness: There is no abdominal tenderness. There is no guarding or rebound.  Genitourinary:    Comments: Foley catheter in place with lots of sediment in the tube Musculoskeletal: Normal range of motion.        General: No tenderness.     Comments: Foul smelling wound to left heel  Skin:    General: Skin is warm.  Neurological:     Motor: No abnormal muscle tone.     Comments: Contracted, does not speak, does not follow commands      ED Treatments / Results  Labs (all labs ordered are listed, but only abnormal results are displayed) Labs Reviewed  URINALYSIS, ROUTINE W REFLEX MICROSCOPIC - Abnormal; Notable for the following components:      Result Value   APPearance CLOUDY (*)    Hgb urine dipstick MODERATE (*)    Leukocytes,Ua LARGE (*)    RBC / HPF >50 (*)    WBC, UA >50 (*)    Bacteria, UA RARE (*)    Non Squamous Epithelial 11-20 (*)    All other components within normal limits  CBC WITH DIFFERENTIAL/PLATELET - Abnormal; Notable for the following components:   RBC 3.86 (*)    Hemoglobin 9.6 (*)    HCT 32.7 (*)    MCH 24.9 (*)    MCHC 29.4 (*)    RDW 19.6 (*)    All other components within normal limits  COMPREHENSIVE METABOLIC PANEL - Abnormal; Notable for the  following components:   CO2 33 (*)    Glucose, Bld 65 (*)    Calcium 8.8 (*)    Total Protein 5.9 (*)    Albumin 2.6 (*)    All other components within normal limits  VALPROIC ACID LEVEL - Abnormal; Notable for the following components:   Valproic Acid Lvl 44 (*)  All other components within normal limits  TSH - Abnormal; Notable for the following components:   TSH 5.986 (*)    All other components within normal limits  POC OCCULT BLOOD, ED - Abnormal; Notable for the following components:   Fecal Occult Bld POSITIVE (*)    All other components within normal limits  CBG MONITORING, ED - Abnormal; Notable for the following components:   Glucose-Capillary 103 (*)    All other components within normal limits  CULTURE, BLOOD (ROUTINE X 2)  CULTURE, BLOOD (ROUTINE X 2)  URINE CULTURE  LACTIC ACID, PLASMA  LACTIC ACID, PLASMA  PROTIME-INR  T4, FREE  POC OCCULT BLOOD, ED  CBG MONITORING, ED    EKG EKG Interpretation  Date/Time:  Saturday May 23 2018 04:30:02 EST Ventricular Rate:  52 PR Interval:    QRS Duration: 119 QT Interval:  487 QTC Calculation: 453 R Axis:   14 Text Interpretation:  * Poor data quality, interpretation may be adversely affected Nonspecific intraventricular conduction delay Borderline repolarization abnormality Artifact in lead(s) I II III aVR aVL aVF V3 Confirmed by Ezequiel Essex [phone]) on 05/23/2018 7:03:02 AM   Radiology Ct Head Wo Contrast  Result Date: 05/23/2018 CLINICAL DATA:  Altered mental status EXAM: CT HEAD WITHOUT CONTRAST TECHNIQUE: Contiguous axial images were obtained from the base of the skull through the vertex without intravenous contrast. COMPARISON:  Head CT April 19, 2018 and brain MRI April 20, 2018 FINDINGS: Brain: Moderate diffuse atrophy is stable. There is no intracranial mass, hemorrhage, extra-axial fluid collection, or midline shift. There is patchy small vessel disease in the centra semiovale bilaterally. No  acute infarct is demonstrable. Vascular: No hyperdense vessel. There is calcification in each carotid siphon region. Skull: The bony calvarium appears intact. Sinuses/Orbits: There is mucosal thickening in several ethmoid air cells. Other visualized paranasal sinuses are clear. Visualized orbits appear symmetric bilaterally. Other: Mastoid air cells are clear. IMPRESSION: Stable atrophy with periventricular small vessel disease. No acute infarct. No mass or hemorrhage. There are foci of arterial vascular calcification. There is mucosal thickening in several ethmoid air cells. Electronically Signed   By: Lowella Grip III M.D.   On: 05/23/2018 05:05   Dg Chest Portable 1 View  Result Date: 05/23/2018 CLINICAL DATA:  Altered mental status EXAM: PORTABLE CHEST 1 VIEW COMPARISON:  April 19, 2018 FINDINGS: There is scarring in the right base region. There is no edema or consolidation. Heart is upper normal in size with pulmonary vascularity normal. There is aortic atherosclerosis. There is advanced arthropathy in both shoulders. IMPRESSION: Scarring right base. No edema or consolidation. Stable cardiac silhouette. Advanced arthropathy in both shoulders, stable. Aortic Atherosclerosis (ICD10-I70.0). Electronically Signed   By: Lowella Grip III M.D.   On: 05/23/2018 05:03    Procedures Procedures (including critical care time)  Medications Ordered in ED Medications  sodium chloride 0.9 % bolus 1,000 mL (1,000 mLs Intravenous New Bag/Given 05/23/18 0730)  sodium chloride 0.9 % bolus 1,000 mL (0 mLs Intravenous Stopped 05/23/18 0640)  dextrose 50 % solution 25 mL (25 mLs Intravenous Given 05/23/18 0513)  meropenem (MERREM) 2 g in sodium chloride 0.9 % 100 mL IVPB (0 g Intravenous Stopped 05/23/18 0640)     Initial Impression / Assessment and Plan / ED Course  I have reviewed the triage vital signs and the nursing notes.  Pertinent labs & imaging results that were available during my care of the  patient were reviewed by me and considered in my medical  decision making (see chart for details).    Patient from nursing facility with change in mental status.  Recent admission for E. coli ESBL UTI.  Patient found to be altered with hypothermia and hypoglycemia on arrival and dry mucous membranes.  She is not able to give a history.  Sepsis work-up initiated with IV fluids and IV antibiotics after cultures obtained.  Bair hugger placed.  Recent admission reviewed.  Patient had ESBL E. coli UTI.  She was seen by palliative care and is apparently on hospice.  Work-up today is consistent with recurrent UTI as well as hypothermia and melanotic stools. Thyroid studies are sent and pending.  Discussed with patient's niece [name] who is listed as her decision-maker.  [phone]. [name] confirmed that patient is DNR and DNI and is in hospice. She would want patient to be treated with IV fluids and antibiotics if necessary.  Patient becoming more alert and saying a few things but not answering questions.  She remains hypothermic and hypotensive and will continue to be given IV fluids.  Admission for IV fluids and IV antibiotics as discussed with Dr. Manuella Ghazi.  IV meropenem started with history of ESBL E. coli.  CRITICAL CARE Performed by: Ezequiel Essex Total critical care time: 35 minutes Critical care time was exclusive of separately billable procedures and treating other patients. Critical care was necessary to treat or prevent imminent or life-threatening deterioration. Critical care was time spent personally by me on the following activities: development of treatment plan with patient and/or surrogate as well as nursing, discussions with consultants, evaluation of patient's response to treatment, examination of patient, obtaining history from patient or surrogate, ordering and performing treatments and interventions, ordering and review of laboratory studies, ordering and review of  radiographic studies, pulse oximetry and re-evaluation of patient's condition.   Final Clinical Impressions(s) / ED Diagnoses   Final diagnoses:  Sepsis with encephalopathy without septic shock, due to unspecified organism [facility])  Hypothermia, initial encounter    ED Discharge Orders    None       Jeily Guthridge, Annie Main, MD 05/23/18 [phone]

## 2018-05-23 NOTE — ED Triage Notes (Signed)
Pt is resident of [facility], reportedly "not acting right"  This am per staff.

## 2018-05-24 DIAGNOSIS — F0281 Dementia in other diseases classified elsewhere with behavioral disturbance: Secondary | ICD-10-CM

## 2018-05-24 DIAGNOSIS — G309 Alzheimer's disease, unspecified: Secondary | ICD-10-CM

## 2018-05-24 LAB — BASIC METABOLIC PANEL
Anion gap: 6 (ref 5–15)
BUN: 12 mg/dL (ref 8–23)
CO2: 31 mmol/L (ref 22–32)
Calcium: 8 mg/dL — ABNORMAL LOW (ref 8.9–10.3)
Chloride: 108 mmol/L (ref 98–111)
Creatinine, Ser: 0.73 mg/dL (ref 0.44–1.00)
GFR calc Af Amer: >60 mL/min (ref >60)
GFR calc non Af Amer: >60 mL/min (ref >60)
Glucose, Bld: 88 mg/dL (ref 70–99)
Potassium: 3.5 mmol/L (ref 3.5–5.1)
Sodium: 145 mmol/L (ref 135–145)

## 2018-05-24 LAB — CBC
HCT: 30.3 % — ABNORMAL LOW (ref 36.0–46.0)
Hemoglobin: 9 g/dL — ABNORMAL LOW (ref 12.0–15.0)
MCH: 25.3 pg — ABNORMAL LOW (ref 26.0–34.0)
MCHC: 29.7 g/dL — ABNORMAL LOW (ref 30.0–36.0)
MCV: 85.1 fL (ref 80.0–100.0)
NRBC: 0 % (ref 0.0–0.2)
Platelets: 150 10*3/uL (ref 150–400)
RBC: 3.56 MIL/uL — ABNORMAL LOW (ref 3.87–5.11)
RDW: 19.2 % — ABNORMAL HIGH (ref 11.5–15.5)
WBC: 7.9 10*3/uL (ref 4.0–10.5)

## 2018-05-24 LAB — LACTIC ACID, PLASMA: Lactic Acid, Venous: 0.7 mmol/L (ref 0.5–1.9)

## 2018-05-24 LAB — T4, FREE: FREE T4: 0.96 ng/dL (ref 0.82–1.77)

## 2018-05-24 MED ORDER — ORAL CARE MOUTH RINSE
15.0000 mL | Freq: Two times a day (BID) | OROMUCOSAL | Status: DC
  Administered 2018-05-25 – 2018-05-28 (×6): 15 mL via OROMUCOSAL

## 2018-05-24 MED ORDER — NYSTATIN 100000 UNIT/GM EX POWD
Freq: Two times a day (BID) | CUTANEOUS | Status: DC
  Administered 2018-05-24 – 2018-05-25 (×4): via TOPICAL
  Administered 2018-05-26: 1 via TOPICAL
  Administered 2018-05-26 – 2018-05-29 (×7): via TOPICAL
  Filled 2018-05-24 (×2): qty 15

## 2018-05-24 NOTE — Progress Notes (Signed)
PROGRESS NOTE    Carmen Christian  GYK:[id number] DOB: 1928/10/21 DOA: 05/23/2018 PCP: Patient, No Pcp Per   Brief Narrative:  Per HPI: Carmen Christian is a 85 y.o. female with medical history significant for anemia, reported paraplegia, chronic left heel ulcer, dyslipidemia, and severe dementia who is brought from her skilled nursing facility for acutely worsening confusion and some behavioral disturbances over the last 1 day which is superimposed on her baseline severe dementia.  She is reportedly under hospice care at this facility.  Patient is essentially noncommunicative due to dementia and is otherwise unable to give any other history.  No reported fevers or chills otherwise noted.  Patient has been admitted with acute metabolic encephalopathy superimposed on advanced dementia presumed to be likely due to UTI from ESBL and has been started on Merrem.  She appears that she may be in the active stages of the dying process, but no family members have been available for discussion at bedside.  Assessment & Plan:   Active Problems:   UTI due to extended-spectrum beta lactamase (ESBL) producing Escherichia coli   Anemia   Dementia (HCC)   Hypotension   Acute encephalopathy  1. Acute metabolic encephalopathy superimposed on advanced dementia secondary to UTI presumed ESBL.  Urine culture pending at this time, but will continue empiric treatment on Merrem as previous.  Will place on some IV fluid as well and monitor closely.  Avoid sedating agents. 2. Advanced dementia.  Will focus on comfort measures for any respiratory distress or anxiety.  Swallow evaluation prior to diet initiation.  Avoid aggressive measures. 3. Hypotension.  Maintain on IV fluid and monitor. 4. Anemia.  Currently appears to be stable.  No overt bleeding noted.  We will plan to repeat labs in a.m.   DVT prophylaxis: Lovenox Code Status: DNR Family Communication: None at bedside and have attempted calls Disposition Plan:  Continue ongoing treatment for UTI and follow-up lab work.  Maintain on IV fluid and obtain palliative consultation by a.m.  Hopefully, will have better luck in a.m. with transitioning to full comfort measures.   Consultants:   None  Procedures:   None  Antimicrobials:   Merrem 2/15->   Subjective: Patient seen and evaluated today with no new acute complaints or concerns. No acute concerns or events noted overnight.  Patient is somnolent this morning.  Objective: Vitals:   05/23/18 1900 05/23/18 1945 05/23/18 2043 05/24/18 0537  BP: (!) 88/52 (!) 88/50 (!) 84/52 98/60  Pulse: 83 90 82 63  Resp: 13 12 12 14   Temp: 98.1 F (36.7 C) 97.9 F (36.6 C) 98.2 F (36.8 C)   TempSrc:   Oral   SpO2: 98% 94% 100% 100%  Weight:   69.9 kg     Intake/Output Summary (Last 24 hours) at 05/24/2018 1037 Last data filed at 05/24/2018 0500 Gross per 24 hour  Intake 290.19 ml  Output 650 ml  Net -359.81 ml   Filed Weights   05/23/18 0930 05/23/18 2043  Weight: 73 kg 69.9 kg    Examination:  General exam: Somnolent Respiratory system: Clear to auscultation. Respiratory effort normal.  On nasal cannula Cardiovascular system: S1 & S2 heard, RRR. No JVD, murmurs, rubs, gallops or clicks. No pedal edema. Gastrointestinal system: Abdomen is nondistended, soft and nontender. No organomegaly or masses felt. Normal bowel sounds heard. Central nervous system: Somnolent Extremities: Symmetric 5 x 5 power. Skin: No rashes, lesions or ulcers Psychiatry: Cannot be assessed.    Data Reviewed: I have  personally reviewed following labs and imaging studies  CBC: Recent Labs  Lab 05/23/18 0412 05/24/18 0706  WBC 6.8 7.9  NEUTROABS 3.4  --   HGB 9.6* 9.0*  HCT 32.7* 30.3*  MCV 84.7 85.1  PLT 189 893   Basic Metabolic Panel: Recent Labs  Lab 05/23/18 0412 05/24/18 0706  NA 143 145  K 3.8 3.5  CL 102 108  CO2 33* 31  GLUCOSE 65* 88  BUN 16 12  CREATININE 0.81 0.73  CALCIUM 8.8*  8.0*   GFR: Estimated Creatinine Clearance: 46.5 mL/min (by C-G formula based on SCr of 0.73 mg/dL). Liver Function Tests: Recent Labs  Lab 05/23/18 0412  AST 21  ALT 17  ALKPHOS 68  BILITOT 0.4  PROT 5.9*  ALBUMIN 2.6*   No results for input(s): LIPASE, AMYLASE in the last 168 hours. No results for input(s): AMMONIA in the last 168 hours. Coagulation Profile: Recent Labs  Lab 05/23/18 0412  INR 0.93   Cardiac Enzymes: No results for input(s): CKTOTAL, CKMB, CKMBINDEX, TROPONINI in the last 168 hours. BNP (last 3 results) No results for input(s): PROBNP in the last 8760 hours. HbA1C: No results for input(s): HGBA1C in the last 72 hours. CBG: Recent Labs  Lab 05/23/18 0559  GLUCAP 103*   Lipid Profile: No results for input(s): CHOL, HDL, LDLCALC, TRIG, CHOLHDL, LDLDIRECT in the last 72 hours. Thyroid Function Tests: Recent Labs    05/23/18 0412  TSH 5.986*   Anemia Panel: No results for input(s): VITAMINB12, FOLATE, FERRITIN, TIBC, IRON, RETICCTPCT in the last 72 hours. Sepsis Labs: Recent Labs  Lab 05/23/18 0412 05/23/18 0602 05/24/18 0706  LATICACIDVEN 0.7 1.1 0.7    Recent Results (from the past 240 hour(s))  Blood culture (routine x 2)     Status: None (Preliminary result)   Collection Time: 05/23/18  4:12 AM  Result Value Ref Range Status   Specimen Description RIGHT ANTECUBITAL  Final   Special Requests   Final    BOTTLES DRAWN AEROBIC AND ANAEROBIC Blood Culture adequate volume   Culture   Final    NO GROWTH 1 DAY Performed at [facility], [address] [place], [place] [address]    Report Status PENDING  Incomplete  Blood culture (routine x 2)     Status: None (Preliminary result)   Collection Time: 05/23/18  4:12 AM  Result Value Ref Range Status   Specimen Description LEFT ANTECUBITAL  Final   Special Requests   Final    BOTTLES DRAWN AEROBIC AND ANAEROBIC Blood Culture adequate volume   Culture   Final    NO GROWTH 1 DAY Performed  at [facility], [address] [place], [place] [address]    Report Status PENDING  Incomplete         Radiology Studies: Ct Head Wo Contrast  Result Date: 05/23/2018 CLINICAL DATA:  Altered mental status EXAM: CT HEAD WITHOUT CONTRAST TECHNIQUE: Contiguous axial images were obtained from the base of the skull through the vertex without intravenous contrast. COMPARISON:  Head CT April 19, 2018 and brain MRI April 20, 2018 FINDINGS: Brain: Moderate diffuse atrophy is stable. There is no intracranial mass, hemorrhage, extra-axial fluid collection, or midline shift. There is patchy small vessel disease in the centra semiovale bilaterally. No acute infarct is demonstrable. Vascular: No hyperdense vessel. There is calcification in each carotid siphon region. Skull: The bony calvarium appears intact. Sinuses/Orbits: There is mucosal thickening in several ethmoid air cells. Other visualized paranasal sinuses are  clear. Visualized orbits appear symmetric bilaterally. Other: Mastoid air cells are clear. IMPRESSION: Stable atrophy with periventricular small vessel disease. No acute infarct. No mass or hemorrhage. There are foci of arterial vascular calcification. There is mucosal thickening in several ethmoid air cells. Electronically Signed   By: Lowella Grip III M.D.   On: 05/23/2018 05:05   Dg Chest Portable 1 View  Result Date: 05/23/2018 CLINICAL DATA:  Altered mental status EXAM: PORTABLE CHEST 1 VIEW COMPARISON:  April 19, 2018 FINDINGS: There is scarring in the right base region. There is no edema or consolidation. Heart is upper normal in size with pulmonary vascularity normal. There is aortic atherosclerosis. There is advanced arthropathy in both shoulders. IMPRESSION: Scarring right base. No edema or consolidation. Stable cardiac silhouette. Advanced arthropathy in both shoulders, stable. Aortic Atherosclerosis (ICD10-I70.0). Electronically Signed   By: Lowella Grip III M.D.   On:  05/23/2018 05:03        Scheduled Meds: . enoxaparin (LOVENOX) injection  40 mg Subcutaneous Q24H  . mouth rinse  15 mL Mouth Rinse BID  . nystatin   Topical BID   Continuous Infusions: . dextrose 5 % and 0.45% NaCl 50 mL/hr at 05/24/18 1002  . meropenem (MERREM) IV 1 g (05/24/18 0702)     LOS: 1 day    Time spent: 30 minutes    Dov Dill Darleen Crocker, DO [facility] Pager [phone]  If 7PM-7AM, please contact night-coverage [email or web address] Password [facility] 05/24/2018, 10:37 AM

## 2018-05-25 ENCOUNTER — Encounter (HOSPITAL_COMMUNITY): Payer: Self-pay | Admitting: *Deleted

## 2018-05-25 DIAGNOSIS — A419 Sepsis, unspecified organism: Secondary | ICD-10-CM

## 2018-05-25 DIAGNOSIS — R6520 Severe sepsis without septic shock: Secondary | ICD-10-CM

## 2018-05-25 DIAGNOSIS — Z7189 Other specified counseling: Secondary | ICD-10-CM

## 2018-05-25 DIAGNOSIS — D649 Anemia, unspecified: Secondary | ICD-10-CM

## 2018-05-25 DIAGNOSIS — Z515 Encounter for palliative care: Secondary | ICD-10-CM

## 2018-05-25 DIAGNOSIS — G9340 Encephalopathy, unspecified: Secondary | ICD-10-CM

## 2018-05-25 DIAGNOSIS — N390 Urinary tract infection, site not specified: Secondary | ICD-10-CM

## 2018-05-25 LAB — CBC
HEMATOCRIT: 30 % — AB (ref 36.0–46.0)
Hemoglobin: 8.9 g/dL — ABNORMAL LOW (ref 12.0–15.0)
MCH: 24.6 pg — ABNORMAL LOW (ref 26.0–34.0)
MCHC: 29.7 g/dL — ABNORMAL LOW (ref 30.0–36.0)
MCV: 82.9 fL (ref 80.0–100.0)
Platelets: 163 10*3/uL (ref 150–400)
RBC: 3.62 MIL/uL — ABNORMAL LOW (ref 3.87–5.11)
RDW: 19.2 % — ABNORMAL HIGH (ref 11.5–15.5)
WBC: 8.7 10*3/uL (ref 4.0–10.5)
nRBC: 0 % (ref 0.0–0.2)

## 2018-05-25 LAB — BASIC METABOLIC PANEL
Anion gap: 8 (ref 5–15)
BUN: 11 mg/dL (ref 8–23)
CO2: 29 mmol/L (ref 22–32)
Calcium: 8.4 mg/dL — ABNORMAL LOW (ref 8.9–10.3)
Chloride: 107 mmol/L (ref 98–111)
Creatinine, Ser: 0.69 mg/dL (ref 0.44–1.00)
GFR calc Af Amer: >60 mL/min (ref >60)
GFR calc non Af Amer: >60 mL/min (ref >60)
GLUCOSE: 78 mg/dL (ref 70–99)
Potassium: 3.8 mmol/L (ref 3.5–5.1)
Sodium: 144 mmol/L (ref 135–145)

## 2018-05-25 NOTE — Consult Note (Signed)
Consultation Note Date: 05/25/2018   Patient Name: Carmen Christian  DOB: [birth year]  MRN: [id number]  Age / Sex: 85 y.o., female  PCP: Patient, No Pcp Per Referring Physician: Rodena Goldmann, DO  Reason for Consultation: Establishing goals of care  HPI/Patient Profile: 85 y.o. female  with past medical history of dementia, multiple myeloma, renal disorder, anxiety, depression, CHF, anemia, chronic left heel ulcer admitted on 05/23/2018 with acute worsening confusion and behavioral disturbances superimposed on underlying dementia. Current [facility] hospice patient at SNF. CT head and chest xray negative for acute findings. Urine culture noted E. Coli. In ED, niece confirmed with physician DNR/DNI but desire for treatment with antibiotics and IVF. Palliative medicine consultation for goals of care/EOL care.   Clinical Assessment and Goals of Care:  I have reviewed medical records, discussed with Dr. Manuella Ghazi and RN assessed the patient at bedside. Carmen Christian opens eyes to voice. She is able to answer simple questions with "yes ma'am" and squeezes right hand on command. By the end of my visit, patient is mumbling incomprehensibly. She does not appear to be in pain or discomfort. No family at bedside.  Patient seen by PMT January 2020 hospitalization. Carmen Christian has stepped in as family Media planner. Carmen Christian is patient's past CNA.   Spoke with [name] via telephone. Carmen Christian is currently in [place] but will be back in town Wednesday and plans to come to the hospital.   I introduced Palliative Medicine as specialized medical care for people living with serious illness. It focuses on providing relief from the symptoms and stress of a serious illness.  We discussed a brief life review of the patient. Carmen Christian shares that her aunt has been a "good aunt" and very loving individual. She is the last living of 7  siblings.  Discussed course of hospitalization including diagnoses and interventions. Expressed concerns with nursing keeping her temperature up. (Still requiring bare hugger). Also expressed concern with nutritional status. Carmen Christian asked if we could try to feed her today. I explained Dr. Trena Platt recommendation for SLP evaluation prior to allowing her to eat. Carmen Christian understands and is hopeful her aunt will be able to work with SLP today.   Discussed disease trajectory of dementia and concern that IVF may be giving her a surge of energy today. Discussed watchful waiting and continuing current plan of care until Carmen Christian is available to further discuss in person. She wishes to continue with current plan of care and plans to meet with PMT provider on Wednesday. Reassured Carmen Christian that I would continue to update her on clinical condition.   Carmen Christian requests I call her at bedside, sharing that her aunt responds well to her name and voice. Attempted to re-call [facility] when at bedside this afternoon. VM left. Updated RN.    SUMMARY OF RECOMMENDATIONS    DNR/DNI. Continue current plan of care and medical management with antibiotic and IVF.  Niece, Carmen Christian, is next-of-kin and has stepped in as Advice worker. Carmen Christian requests we try and feed her. SLP evaluation  ordered. Seen by SLP in January and recommended a dysphagia 1 diet.   Current [facility] hospice patient. Niece in [place] and unavailable to meet with PMT provider until Wednesday, 2/19. PMT will follow and plan to meet with niece on Wednesday to further discuss [place].   Code Status/Advance Care Planning:  DNR  Symptom Management:   Per attending  Palliative Prophylaxis:   Aspiration, Delirium Protocol, Oral Care and Turn Reposition  Psycho-social/Spiritual:   Desire for further Chaplaincy support:yes  Additional Recommendations: Caregiving  Support/Resources, Compassionate Wean Education and Education on Hospice  Prognosis:   Unable to  determine  Discharge Planning: To Be Determined      Primary Diagnoses: Present on Admission: . Acute encephalopathy . Dementia ([place]) . UTI due to extended-spectrum beta lactamase (ESBL) producing Escherichia coli . Hypotension . Anemia   I have reviewed the medical record, interviewed the patient and family, and examined the patient. The following aspects are pertinent.  Past Medical History:  Diagnosis Date  . Anemia   . CHF (congestive heart failure) ([place])   . Dementia ([place])   . Depression   . Dizziness   . High cholesterol   . Hypercholesterolemia   . Multiple myeloma ([place])   . Renal disorder   . Vitamin D deficiency    Social History   Socioeconomic History  . Marital status: Widowed    Spouse name: Not on file  . Number of children: Not on file  . Years of education: Not on file  . Highest education level: Not on file  Occupational History  . Not on file  Social Needs  . Financial resource strain: Not on file  . Food insecurity:    Worry: Not on file    Inability: Not on file  . Transportation needs:    Medical: Not on file    Non-medical: Not on file  Tobacco Use  . Smoking status: Unknown If Ever Smoked  . Smokeless tobacco: Never Used  Substance and Sexual Activity  . Alcohol use: No    Frequency: Never  . Drug use: Never  . Sexual activity: Not on file  Lifestyle  . Physical activity:    Days per week: Not on file    Minutes per session: Not on file  . Stress: Not on file  Relationships  . Social connections:    Talks on phone: Not on file    Gets together: Not on file    Attends religious service: Not on file    Active member of club or organization: Not on file    Attends meetings of clubs or organizations: Not on file    Relationship status: Not on file  Other Topics Concern  . Not on file  Social History Narrative  . Not on file   No family history on file. Scheduled Meds: . enoxaparin (LOVENOX) injection  40 mg Subcutaneous  Q24H  . mouth rinse  15 mL Mouth Rinse BID  . nystatin   Topical BID   Continuous Infusions: . dextrose 5 % and 0.45% NaCl 50 mL/hr at 05/25/18 0603  . meropenem (MERREM) IV 1 g (05/25/18 0600)   PRN Meds:.acetaminophen **OR** acetaminophen, ondansetron **OR** ondansetron (ZOFRAN) IV Medications Prior to Admission:  Prior to Admission medications   Medication Sig Start Date End Date Taking? Authorizing Provider  Acetaminophen (TYLENOL ARTHRITIS EXT RELIEF PO) Take 650 mg by mouth daily as needed (for mild pain).   Yes [provider]  albuterol (PROVENTIL HFA;VENTOLIN HFA) 108 (90  Base) MCG/ACT inhaler Inhale 2 puffs into the lungs every 6 (six) hours as needed for wheezing or shortness of breath.   Yes [provider]  diclofenac sodium (VOLTAREN) 1 % GEL Apply 2 g topically 4 (four) times daily.   Yes [provider]  divalproex (DEPAKOTE) 250 MG DR tablet Take 250 mg by mouth 2 (two) times daily.    Yes [provider]  Fe Fum-FePoly-FA-Vit C-Vit B3 (INTEGRA F PO) Take 1 capsule by mouth daily.   Yes [provider]  furosemide (LASIX) 20 MG tablet Take 20 mg by mouth daily.   Yes [provider]  gabapentin (NEURONTIN) 300 MG capsule Take 300 mg by mouth 3 (three) times daily.   Yes [provider]  LORazepam (ATIVAN) 0.5 MG tablet Take 0.5 mg by mouth daily as needed. Take 1/2 tablet daily as need for anxiety    Yes [provider]  meclizine (ANTIVERT) 25 MG tablet Take 25 mg by mouth every 6 (six) hours as needed for dizziness.   Yes [provider]  sertraline (ZOLOFT) 100 MG tablet Take 100 mg by mouth daily.    Yes [provider]  Skin Protectants, Misc. (MINERIN) CREA Apply 1 application topically daily as needed (to lower legs for dry skin).   Yes [provider]  traZODone (DESYREL) 50 MG tablet Take 50 mg by mouth at bedtime.   Yes [provider]  zinc oxide 20 %  ointment Apply 1 application topically 3 (three) times daily as needed for irritation (applied to bilateral buttocks).   Yes [provider]   No Known Allergies Review of Systems  Unable to perform ROS: Dementia   Physical Exam Vitals signs and nursing note reviewed.  Constitutional:      Appearance: She is ill-appearing.  HENT:     Head: Normocephalic and atraumatic.  Pulmonary:     Effort: No tachypnea, accessory muscle usage or respiratory distress.  Abdominal:     Tenderness: There is no abdominal tenderness.  Skin:    General: Skin is warm and dry.  Neurological:     Mental Status: She is easily aroused.     Comments: Opens eyes to voice. Squeezes right hand on command. Continuously states "yes ma'am" to questions and then incomprehensible mumbling.  Psychiatric:        Attention and Perception: She is inattentive.        Cognition and Memory: Cognition is impaired.     Comments: Incomprehensible speech    Vital Signs: BP 118/73 (BP Location: Right Arm)   Pulse 78   Temp 98.6 F (37 C) (Oral)   Resp 18   Ht '5\' 1"'$  (1.549 m)   Wt 69.9 kg   SpO2 99%   BMI 29.12 kg/m  Pain Scale: PAINAD   Pain Score: 0-No pain   SpO2: SpO2: 99 % O2 Device:SpO2: 99 % O2 Flow Rate: .O2 Flow Rate (L/min): 3 L/min  IO: Intake/output summary:   Intake/Output Summary (Last 24 hours) at 05/25/2018 1121 Last data filed at 05/25/2018 0641 Gross per 24 hour  Intake 1919.64 ml  Output 500 ml  Net 1419.64 ml    LBM:   Baseline Weight: Weight: 73 kg Most recent weight: Weight: 69.9 kg     Palliative Assessment/Data: PPS 30%     Time In: 1100 Time Out: 1200 Time Total: 60 Greater than 50%  of this time was spent counseling and coordinating care related to the above assessment and  plan.  Signed by:  Ihor Dow, FNP-C Palliative Medicine Team  Phone: [phone] Fax: [phone]   Please contact Palliative Medicine Team phone at [phone] for questions and  concerns.  For individual provider: See Shea Evans

## 2018-05-25 NOTE — Progress Notes (Signed)
PROGRESS NOTE    Carmen Christian  VPX:[id number] DOB: 07-06-1928 DOA: 05/23/2018 PCP: Patient, No Pcp Per   Brief Narrative:  Per HPI: Dustin Stadleris a 85 y.o.femalewith medical history significant foranemia, reported paraplegia, chronic left heel ulcer, dyslipidemia, and severe dementia who is brought from her skilled nursing facility for acutely worsening confusion and some behavioral disturbances over the last 1 day which is superimposed on her baseline severe dementia. She is reportedly under hospice care at this facility. Patient is essentially noncommunicative due to dementia and is otherwise unable to give any other history. No reported fevers or chills otherwise noted.  Patient has been admitted with acute metabolic encephalopathy superimposed on advanced dementia presumed to be likely due to UTI from ESBL and has been started on Merrem.  She appears that she may be in the active stages of the dying process, and palliative has evaluated with recommendations for SLP evaluation today.  Needs to arrive on Wednesday of this [place].  Assessment & Plan:   Active Problems:   UTI due to extended-spectrum beta lactamase (ESBL) producing Escherichia coli   Anemia   Dementia (HCC)   Hypotension   Acute encephalopathy  1. Acute metabolic encephalopathy superimposed on advanced dementia secondary to UTI presumed ESBL. Urine culture pending at this time with noted E. coli and no sensitivities, but will continue empiric treatment on Merrem as previous. Will place on some IV fluid as well and monitor closely. Avoid sedating agents.  She is also noted to have gram-positive rods in urine which we will continue follow-up. 2. Advanced dementia. Will focus on comfort measures for any respiratory distress or anxiety. Swallow evaluation prior to diet initiation. Avoid aggressive measures. 3. Hypotension. Maintain on IV fluid and monitor. 4. Anemia. Currently appears to be  stable. No overt bleeding noted. Repeat CBC has remained stable.  Will avoid further lab work in a.m.   DVT prophylaxis: Lovenox Code Status: DNR Family Communication:  Palliative for spoken with niece who is currently in [place] Disposition Plan: Continue ongoing treatment for UTI and follow-up lab work.  Maintain on IV fluid.  Appreciate of of palliative care evaluation.   Consultants:   None  Procedures:   None  Antimicrobials:   Merrem 2/15->  Subjective: Patient seen and evaluated today with no new acute complaints or concerns. No acute concerns or events noted overnight.  She is very minimally responsive to questioning and appears to be comfortable.  Objective: Vitals:   05/24/18 2130 05/25/18 0209 05/25/18 0611 05/25/18 1019  BP: (!) 123/92 (!) 123/92 122/74 118/73  Pulse: 84 84 87 78  Resp: 16 16 20 18   Temp: 98.2 F (36.8 C) 98.2 F (36.8 C) 98.4 F (36.9 C) 98.6 F (37 C)  TempSrc: Oral Oral Oral Oral  SpO2: 99%  99%   Weight:  69.9 kg    Height:  5\' 1"  (1.549 m)      Intake/Output Summary (Last 24 hours) at 05/25/2018 1205 Last data filed at 05/25/2018 0641 Gross per 24 hour  Intake 1919.64 ml  Output 500 ml  Net 1419.64 ml   Filed Weights   05/23/18 0930 05/23/18 2043 05/25/18 0209  Weight: 73 kg 69.9 kg 69.9 kg    Examination:  General exam: Appears calm and comfortable  Respiratory system: Clear to auscultation. Respiratory effort normal. Cardiovascular system: S1 & S2 heard, RRR. No JVD, murmurs, rubs, gallops or clicks. No pedal edema. Gastrointestinal system: Abdomen is nondistended, soft and  nontender. No organomegaly or masses felt. Normal bowel sounds heard. Central nervous system: Alert and oriented. No focal neurological deficits. Extremities: Symmetric 5 x 5 power. Skin: No rashes, lesions or ulcers Psychiatry: Judgement and insight appear normal. Mood & affect appropriate.     Data Reviewed: I have personally reviewed  following labs and imaging studies  CBC: Recent Labs  Lab 05/23/18 0412 05/24/18 0706 05/25/18 0525  WBC 6.8 7.9 8.7  NEUTROABS 3.4  --   --   HGB 9.6* 9.0* 8.9*  HCT 32.7* 30.3* 30.0*  MCV 84.7 85.1 82.9  PLT 189 150 277   Basic Metabolic Panel: Recent Labs  Lab 05/23/18 0412 05/24/18 0706 05/25/18 0525  NA 143 145 144  K 3.8 3.5 3.8  CL 102 108 107  CO2 33* 31 29  GLUCOSE 65* 88 78  BUN 16 12 11   CREATININE 0.81 0.73 0.69  CALCIUM 8.8* 8.0* 8.4*   GFR: Estimated Creatinine Clearance: 44.3 mL/min (by C-G formula based on SCr of 0.69 mg/dL). Liver Function Tests: Recent Labs  Lab 05/23/18 0412  AST 21  ALT 17  ALKPHOS 68  BILITOT 0.4  PROT 5.9*  ALBUMIN 2.6*   No results for input(s): LIPASE, AMYLASE in the last 168 hours. No results for input(s): AMMONIA in the last 168 hours. Coagulation Profile: Recent Labs  Lab 05/23/18 0412  INR 0.93   Cardiac Enzymes: No results for input(s): CKTOTAL, CKMB, CKMBINDEX, TROPONINI in the last 168 hours. BNP (last 3 results) No results for input(s): PROBNP in the last 8760 hours. HbA1C: No results for input(s): HGBA1C in the last 72 hours. CBG: Recent Labs  Lab 05/23/18 0559  GLUCAP 103*   Lipid Profile: No results for input(s): CHOL, HDL, LDLCALC, TRIG, CHOLHDL, LDLDIRECT in the last 72 hours. Thyroid Function Tests: Recent Labs    05/23/18 0412  TSH 5.986*  FREET4 0.96   Anemia Panel: No results for input(s): VITAMINB12, FOLATE, FERRITIN, TIBC, IRON, RETICCTPCT in the last 72 hours. Sepsis Labs: Recent Labs  Lab 05/23/18 0412 05/23/18 0602 05/24/18 0706  LATICACIDVEN 0.7 1.1 0.7    Recent Results (from the past 240 hour(s))  Blood culture (routine x 2)     Status: None (Preliminary result)   Collection Time: 05/23/18  4:12 AM  Result Value Ref Range Status   Specimen Description   Final    RIGHT ANTECUBITAL Performed at [facility], [address] [place], [place] [address]    Special  Requests   Final    BOTTLES DRAWN AEROBIC AND ANAEROBIC Blood Culture adequate volume Performed at [facility], [address] [place], [place] [address]    Culture  Setup Time   Final    GRAM POSITIVE RODS Gram Stain Report Called to,Read Back By and Verified With: DILDY,V. AT 1626 ON 05/24/2018 BY EVA AEROBIC BOTTLE ONLY Performed at [facility] Performed at [facility], [address] [place], [place] [address]    Culture   Final    CULTURE REINCUBATED FOR BETTER GROWTH Performed at [facility], [place] [address] [place], [place] [address]    Report Status PENDING  Incomplete  Blood culture (routine x 2)     Status: None (Preliminary result)   Collection Time: 05/23/18  4:12 AM  Result Value Ref Range Status   Specimen Description LEFT ANTECUBITAL  Final   Special Requests   Final    BOTTLES DRAWN AEROBIC AND ANAEROBIC Blood Culture adequate volume   Culture  Final    NO GROWTH 2 DAYS Performed at [facility], [address], [place], [place] [address]    Report Status PENDING  Incomplete  Urine Culture     Status: Abnormal (Preliminary result)   Collection Time: 05/23/18  4:34 AM  Result Value Ref Range Status   Specimen Description URINE, CLEAN CATCH  Final   Special Requests   Final    NONE Performed at [facility], [address], [place], [place] [address]    Culture 70,000 COLONIES/mL ESCHERICHIA COLI (A)  Final   Report Status PENDING  Incomplete         Radiology Studies: No results found.      Scheduled Meds: . enoxaparin (LOVENOX) injection  40 mg Subcutaneous Q24H  . mouth rinse  15 mL Mouth Rinse BID  . nystatin   Topical BID   Continuous Infusions: . dextrose 5 % and 0.45% NaCl 50 mL/hr at 05/25/18 0603  . meropenem (MERREM) IV 1 g (05/25/18 0600)     LOS: 2 days    Time spent: 30 minutes    Arvella Massingale Darleen Crocker, DO [facility] Pager [phone]  If 7PM-7AM, please contact night-coverage [email or web address] Password  [facility] 05/25/2018, 12:05 PM

## 2018-05-25 NOTE — Progress Notes (Signed)
Nutrition Brief Note  Patient is an 85 y.o.femalefrom SNF with PMH: severe dementia, chronic left heel ulcer,anemia she presents with and acute worsening confusion.  She is receiving hospice care at [facility]. Palliative is in discussion with family regarding GOC. ST has assessed. Comfort is goal at this time. No further follow up indicated at this time.  Colman Cater MS,RD,CSG,LDN Office: [phone] Pager: [phone]

## 2018-05-25 NOTE — Evaluation (Signed)
Clinical/Bedside Swallow Evaluation Patient Details  Name: Carmen Christian MRN: [id number] Date of Birth: Jul 04, 1928  Today's Date: 05/25/2018 Time: SLP Start Time (ACUTE ONLY): 1358 SLP Stop Time (ACUTE ONLY): 1418 SLP Time Calculation (min) (ACUTE ONLY): 20 min  Past Medical History:  Past Medical History:  Diagnosis Date  . Anemia   . CHF (congestive heart failure) ([place])   . Dementia ([place])   . Depression   . Dizziness   . High cholesterol   . Hypercholesterolemia   . Multiple myeloma ([place])   . Renal disorder   . Vitamin D deficiency    Past Surgical History: No past surgical history on file. HPI:  Carmen Christian is a 85 y.o. female with medical history significant for anemia, reported paraplegia, chronic left heel ulcer, dyslipidemia, and severe dementia who is brought from her skilled nursing facility for acutely worsening confusion and some behavioral disturbances over the last 1 day which is superimposed on her baseline severe dementia.  She is reportedly under hospice care at this facility.  Patient is essentially noncommunicative due to dementia and is otherwise unable to give any other history. MD requesting BSE prior to diet initiation. BSE previously completed on 04/20/2018, less than a month ago, during most recent hospitalization.   Assessment / Plan / Recommendation Clinical Impression  Pt was easily roused to appropriate alertness for PO; she was repositioned in bed to be sitting upright. She made grunting, humming noises throughout eval and said some non-sensical words throughout evaluation. Pt with similar presentation from BSE on file 04/20/2018, She consumed thin liquids via straw (~150 cc's of thin liquids) without overt s/sx of aspiration. Pt is unable to self feed but was fed tsp bites of puree with prolonged AP transit time and occasional oral holding requiring moderate verbal cues to "swallow". Recommend initiate D1/puree diet with thin liquids. Meds to be crushed in puree.  Recommend precautions re: Position pt at 90 degrees for all PO, verbally communicate with her and prepare her encourage her to look at the spoon before presentation given, utilize straw for liquid administration. Alternate bites and sips. Also recommend Nursing to check oral cavity after meals and intermittently during meals for pocketed food or food being held in oral cavity. There are no further ST needs noted at this time. ST to sign off.  SLP Visit Diagnosis: Dysphagia, oropharyngeal phase (R13.12)    Aspiration Risk  Mild aspiration risk    Diet Recommendation Dysphagia 1 (Puree);Thin liquid   Liquid Administration via: Straw;Cup Medication Administration: Crushed with puree Supervision: Full supervision/cueing for compensatory strategies Compensations: Minimize environmental distractions;Slow rate;Small sips/bites;Follow solids with liquid Postural Changes: Seated upright at 90 degrees;Remain upright for at least 30 minutes after po intake    Other  Recommendations Oral Care Recommendations: Oral care BID   Follow up Recommendations 24 hour supervision/assistance;Skilled Nursing facility        Swallow Study   General Date of Onset: 05/23/18 HPI: Carmen Christian is a 85 y.o. female with medical history significant for anemia, reported paraplegia, chronic left heel ulcer, dyslipidemia, and severe dementia who is brought from her skilled nursing facility for acutely worsening confusion and some behavioral disturbances over the last 1 day which is superimposed on her baseline severe dementia.  She is reportedly under hospice care at this facility.  Patient is essentially noncommunicative due to dementia and is otherwise unable to give any other history. MD requesting BSE prior to diet initiation. BSE previously completed on 04/20/2018, less than a month  ago, during most recent hospitalization. Type of Study: Bedside Swallow Evaluation Previous Swallow Assessment: BSE 04/20/2018 Diet Prior to  this Study: NPO Temperature Spikes Noted: No Respiratory Status: Nasal cannula History of Recent Intubation: No Behavior/Cognition: Alert;Requires cueing;Confused Oral Cavity Assessment: Within Functional Limits Oral Care Completed by SLP: Recent completion by staff Oral Cavity - Dentition: Edentulous Vision: Functional for self-feeding Self-Feeding Abilities: Total assist Patient Positioning: Upright in bed Baseline Vocal Quality: Normal Volitional Cough: Cognitively unable to elicit Volitional Swallow: Unable to elicit    Oral/Motor/Sensory Function Overall Oral Motor/Sensory Function: Within functional limits   Ice Chips     Thin Liquid Thin Liquid: Within functional limits    Nectar Thick Nectar Thick Liquid: Not tested   Honey Thick Honey Thick Liquid: Not tested   Puree Puree: Within functional limits   Solid    Carmen Christian H. Roddie Mc, CCC-SLP Speech Language Pathologist  Solid: Not tested      Wende Bushy 05/25/2018,2:55 PM

## 2018-05-26 DIAGNOSIS — F0390 Unspecified dementia without behavioral disturbance: Secondary | ICD-10-CM

## 2018-05-26 DIAGNOSIS — R1310 Dysphagia, unspecified: Secondary | ICD-10-CM

## 2018-05-26 DIAGNOSIS — F0391 Unspecified dementia with behavioral disturbance: Secondary | ICD-10-CM

## 2018-05-26 LAB — CULTURE, BLOOD (ROUTINE X 2): Special Requests: ADEQUATE

## 2018-05-26 LAB — BASIC METABOLIC PANEL
Anion gap: 6 (ref 5–15)
BUN: 8 mg/dL (ref 8–23)
CALCIUM: 8.1 mg/dL — AB (ref 8.9–10.3)
CHLORIDE: 106 mmol/L (ref 98–111)
CO2: 29 mmol/L (ref 22–32)
Creatinine, Ser: 0.71 mg/dL (ref 0.44–1.00)
GFR calc Af Amer: >60 mL/min (ref >60)
GFR calc non Af Amer: >60 mL/min (ref >60)
Glucose, Bld: 78 mg/dL (ref 70–99)
Potassium: 3.7 mmol/L (ref 3.5–5.1)
Sodium: 141 mmol/L (ref 135–145)

## 2018-05-26 NOTE — Progress Notes (Signed)
Pharmacy Antibiotic Note  Carmen Christian is a 85 y.o. female admitted on 05/23/2018 with ESBL UTI.  Pharmacy has been consulted for meropenem dosing. Patient improving, afebrile, awaiting susceptibility  Plan: Continue Meropenem 1000 mg IV every 12 hours  Monitor labs, c/s, and patient improvement  Height: 5\' 1"  (154.9 cm) Weight: 154 lb 1.6 oz (69.9 kg) IBW/kg (Calculated) : 47.8  Temp (24hrs), Avg:98.5 F (36.9 C), Min:97.4 F (36.3 C), Max:99.7 F (37.6 C)  Recent Labs  Lab 05/23/18 0412 05/23/18 0602 05/24/18 0706 05/25/18 0525 05/26/18 0620  WBC 6.8  --  7.9 8.7  --   CREATININE 0.81  --  0.73 0.69 0.71  LATICACIDVEN 0.7 1.1 0.7  --   --     Estimated Creatinine Clearance: 44.3 mL/min (by C-G formula based on SCr of 0.71 mg/dL).    No Known Allergies  Antimicrobials this admission: Merrem 2/15 >>      Dose adjustments this admission: [place]  Microbiology results: 2/15 BCx: 1/2= GPR; re-incubated for better growth, corynebacterium(contaminant likely) 2/15 UCx: 70K CFU E. Coli   Thank you for allowing pharmacy to be a part of this patient's care.  Cristy Friedlander 05/26/2018 4:09 PM

## 2018-05-26 NOTE — Progress Notes (Signed)
Daily Progress Note   Patient Name: Carmen Christian       Date: 05/26/2018 DOB: Nov 16, 1928  Age: 85 y.o. MRN#: [id number] Attending Physician: Rodena Goldmann, DO Primary Care Physician: Patient, No Pcp Per Admit Date: 05/23/2018  Reason for Consultation/Follow-up: Establishing goals of care  Subjective: Patient wakes to voice. Denies pain. Squeezes bilateral hands on command. Pleasantly confused with baseline dementia. No s/s of discomfort or distress.   No family at bedside.  Length of Stay: 3  Current Medications: Scheduled Meds:  . enoxaparin (LOVENOX) injection  40 mg Subcutaneous Q24H  . mouth rinse  15 mL Mouth Rinse BID  . nystatin   Topical BID    Continuous Infusions: . dextrose 5 % and 0.45% NaCl 50 mL/hr at 05/26/18 0223  . meropenem (MERREM) IV 1 g (05/26/18 0542)    PRN Meds: acetaminophen **OR** acetaminophen, ondansetron **OR** ondansetron (ZOFRAN) IV  Physical Exam Vitals signs and nursing note reviewed.  Constitutional:      Appearance: She is ill-appearing.  HENT:     Head: Normocephalic and atraumatic.  Pulmonary:     Effort: No tachypnea, accessory muscle usage or respiratory distress.  Abdominal:     Tenderness: There is no abdominal tenderness.  Skin:    General: Skin is warm and dry.  Neurological:     Mental Status: She is easily aroused.     Comments: Wakes to voice. Denies pain. Follows simple commands. Disoriented with baseline dementia  Psychiatric:        Attention and Perception: She is inattentive.        Speech: Speech is delayed.        Behavior: Behavior is cooperative.        Cognition and Memory: Cognition is impaired.            Vital Signs: BP 111/64 (BP Location: Left Arm)   Pulse 82   Temp 99.7 F (37.6 C) (Oral)   Resp 18    Ht _0  (1.549 m)   Wt 69.9 kg   SpO2 97%   BMI 29.12 kg/m  SpO2: SpO2: 97 % O2 Device: O2 Device: Nasal Cannula O2 Flow Rate: O2 Flow Rate (L/min): 3 L/min  Intake/output summary:   Intake/Output Summary (Last 24 hours) at 05/26/2018 1217 Last data filed at 05/26/2018 0820 Gross  per 24 hour  Intake 0 ml  Output 1100 ml  Net -1100 ml   LBM:   Baseline Weight: Weight: 73 kg Most recent weight: Weight: 69.9 kg       Palliative Assessment/Data: PPS 30%      Patient Active Problem List   Diagnosis Date Noted  . Sepsis with encephalopathy without septic shock ([place])   . Urinary tract infection without hematuria   . Acute encephalopathy 05/23/2018  . Advanced care planning/counseling discussion   . Goals of care, counseling/discussion   . Palliative care by specialist   . Acute cystitis without hematuria   . Weakness   . UTI due to extended-spectrum beta lactamase (ESBL) producing Escherichia coli 04/19/2018  . Leukocytosis 04/19/2018  . Anemia 04/19/2018  . Dementia ([place]) 04/19/2018  . CHF (congestive heart failure) ([place]) 04/19/2018  . Chronic ulcer of left heel ([place]) 04/19/2018  . Multiple myeloma ([place]) 04/19/2018  . Hypotension 04/19/2018  . Dehydration 04/19/2018    Palliative Care Assessment & Plan   Patient Profile: 85 y.o. female  with past medical history of dementia, multiple myeloma, renal disorder, anxiety, depression, CHF, anemia, chronic left heel ulcer admitted on 05/23/2018 with acute worsening confusion and behavioral disturbances superimposed on underlying dementia. Current [facility] hospice patient at SNF. CT head and chest xray negative for acute findings. Urine culture noted E. Coli. In ED, niece confirmed with physician DNR/DNI but desire for treatment with antibiotics and IVF. Palliative medicine consultation for goals of care/EOL care.   Assessment: Acute metabolic encephalopathy Advanced dementia ESBL  UTI Anemia  Recommendations/Plan:  DNR/DNI. Continue current plan of care.    Appreciate SLP evaluation. Patient will require full assist with feeds.   Current [facility] hospice patient. Family unavailable to meet with PMT provider until 2/19. Will f/u tomorrow.  Code Status: DNR   Code Status Orders  (From admission, onward)         Start     Ordered   05/23/18 0830  Do not attempt resuscitation (DNR)  Continuous    Question Answer Comment  In the event of cardiac or respiratory ARREST Do not call a "code blue"   In the event of cardiac or respiratory ARREST Do not perform Intubation, CPR, defibrillation or ACLS   In the event of cardiac or respiratory ARREST Use medication by any route, position, wound care, and other measures to relive pain and suffering. May use oxygen, suction and manual treatment of airway obstruction as needed for comfort.      05/23/18 0831        Code Status History    Date Active Date Inactive Code Status Order ID Comments User Context   04/19/2018 1515 04/23/2018 2040 DNR [id number]  Murlean Iba, MD ED       Prognosis:   Unable to determine  Discharge Planning:  To Be Determined  Care plan was discussed with RN  Thank you for allowing the Palliative Medicine Team to assist in the care of this patient.   Time In: 1045 Time Out: 1100 Total Time 15 Prolonged Time Billed  no      Greater than 50%  of this time was spent counseling and coordinating care related to the above assessment and plan.  Ihor Dow, FNP-C Palliative Medicine Team  Phone: [phone] Fax: [phone]  Please contact Palliative Medicine Team phone at [phone] for questions and concerns.

## 2018-05-26 NOTE — Progress Notes (Signed)
PROGRESS NOTE    Carmen Christian  WGY:[id number] DOB: 1928-06-18 DOA: 05/23/2018 PCP: Patient, No Pcp Per   Brief Narrative:  Per HPI: Carmen Stadleris a 85 y.o.femalewith medical history significant foranemia, reported paraplegia, chronic left heel ulcer, dyslipidemia, and severe dementia who is brought from her skilled nursing facility for acutely worsening confusion and some behavioral disturbances over the last 1 day which is superimposed on her baseline severe dementia. She is reportedly under hospice care at this facility. Patient is essentially noncommunicative due to dementia and is otherwise unable to give any other history. No reported fevers or chills otherwise noted.  Patient has been admitted with acute metabolic encephalopathy superimposed on advanced dementia presumed to be likely due to UTI from ESBL and has been started on Merrem. She appears that she may be in the active stages of the dying process, and palliative has evaluated with recommendations for SLP evaluation was recommended dysphagia 1 diet.  Palliative care planning to meet with niece who will arrive on 2/19 for further discussion regarding management.  Further culture data and sensitivities still pending.   Assessment & Plan:   Active Problems:   UTI due to extended-spectrum beta lactamase (ESBL) producing Escherichia coli   Anemia   Dementia (HCC)   Hypotension   Acute encephalopathy   Sepsis with encephalopathy without septic shock (HCC)   Urinary tract infection without hematuria  1. Acute metabolic encephalopathy superimposed on advanced dementia secondary to UTI presumed ESBL. Urine culture pending at this time with noted E. coli and no sensitivities, but will continue empiric treatment on Merrem as previous. Will place on some IV fluid as well and monitor closely. Avoid sedating agents.  She is also noted to have gram-positive rods in blood which we will continue follow-up. 2. Advanced dementia.  Will focus on comfort measures for any respiratory distress or anxiety. Swallow evaluation prior to diet initiation. Avoid aggressive measures. 3. Hypotension. Maintain on IV fluid and monitor. 4. Anemia. Currently appears to be stable. No overt bleeding noted. Repeat CBC has remained stable.  Will avoid further lab work in a.m.   DVT prophylaxis:Lovenox Code Status:DNR Family Communication: Palliative for spoken with niece who is currently in [place] Disposition Plan:Continue ongoing treatment for UTI.  Maintain on IV fluid with dextrose.  Palliative to have meeting with niece who should arrive at bedside on 2/19.  Further disposition per this discussion.   Consultants:  Palliative care  Procedures:  None  Antimicrobials:  Merrem 2/15->  Subjective: Patient seen and evaluated today with no new acute complaints or concerns. No acute concerns or events noted overnight.  She is very minimally responsive to questioning and appears to be comfortable.  Objective: Vitals:   05/25/18 1431 05/25/18 1802 05/25/18 2210 05/26/18 0630  BP: 100/68 121/74 130/77 111/64  Pulse: 89 86 90 82  Resp: 18 18 18 18   Temp: 98.8 F (37.1 C) 98.7 F (37.1 C) (!) 97.4 F (36.3 C) 99.7 F (37.6 C)  TempSrc: Oral Axillary Axillary Oral  SpO2: 99% 99% 97%   Weight:      Height:        Intake/Output Summary (Last 24 hours) at 05/26/2018 1213 Last data filed at 05/26/2018 0820 Gross per 24 hour  Intake 0 ml  Output 1100 ml  Net -1100 ml   Filed Weights   05/23/18 0930 05/23/18 2043 05/25/18 0209  Weight: 73 kg 69.9 kg 69.9 kg    Examination:  General exam: Appears calm and comfortable  Respiratory  system: Clear to auscultation. Respiratory effort normal.  Currently on nasal cannula. Cardiovascular system: S1 & S2 heard, RRR. No JVD, murmurs, rubs, gallops or clicks. No pedal edema. Gastrointestinal system: Abdomen is nondistended, soft and nontender. No organomegaly or  masses felt. Normal bowel sounds heard. Central nervous system: Alert and awake. Extremities: Symmetric 5 x 5 power. Skin: No rashes, lesions or ulcers Psychiatry: Cannot be appropriately assessed.    Data Reviewed: I have personally reviewed following labs and imaging studies  CBC: Recent Labs  Lab 05/23/18 0412 05/24/18 0706 05/25/18 0525  WBC 6.8 7.9 8.7  NEUTROABS 3.4  --   --   HGB 9.6* 9.0* 8.9*  HCT 32.7* 30.3* 30.0*  MCV 84.7 85.1 82.9  PLT 189 150 671   Basic Metabolic Panel: Recent Labs  Lab 05/23/18 0412 05/24/18 0706 05/25/18 0525 05/26/18 0620  NA 143 145 144 141  K 3.8 3.5 3.8 3.7  CL 102 108 107 106  CO2 33* 31 29 29   GLUCOSE 65* 88 78 78  BUN 16 12 11 8   CREATININE 0.81 0.73 0.69 0.71  CALCIUM 8.8* 8.0* 8.4* 8.1*   GFR: Estimated Creatinine Clearance: 44.3 mL/min (by C-G formula based on SCr of 0.71 mg/dL). Liver Function Tests: Recent Labs  Lab 05/23/18 0412  AST 21  ALT 17  ALKPHOS 68  BILITOT 0.4  PROT 5.9*  ALBUMIN 2.6*   No results for input(s): LIPASE, AMYLASE in the last 168 hours. No results for input(s): AMMONIA in the last 168 hours. Coagulation Profile: Recent Labs  Lab 05/23/18 0412  INR 0.93   Cardiac Enzymes: No results for input(s): CKTOTAL, CKMB, CKMBINDEX, TROPONINI in the last 168 hours. BNP (last 3 results) No results for input(s): PROBNP in the last 8760 hours. HbA1C: No results for input(s): HGBA1C in the last 72 hours. CBG: Recent Labs  Lab 05/23/18 0559  GLUCAP 103*   Lipid Profile: No results for input(s): CHOL, HDL, LDLCALC, TRIG, CHOLHDL, LDLDIRECT in the last 72 hours. Thyroid Function Tests: No results for input(s): TSH, T4TOTAL, FREET4, T3FREE, THYROIDAB in the last 72 hours. Anemia Panel: No results for input(s): VITAMINB12, FOLATE, FERRITIN, TIBC, IRON, RETICCTPCT in the last 72 hours. Sepsis Labs: Recent Labs  Lab 05/23/18 0412 05/23/18 0602 05/24/18 0706  LATICACIDVEN 0.7 1.1 0.7     Recent Results (from the past 240 hour(s))  Blood culture (routine x 2)     Status: None (Preliminary result)   Collection Time: 05/23/18  4:12 AM  Result Value Ref Range Status   Specimen Description   Final    RIGHT ANTECUBITAL Performed at [facility], [address], [place], [place] [address]    Special Requests   Final    BOTTLES DRAWN AEROBIC AND ANAEROBIC Blood Culture adequate volume Performed at [facility], [address], [place], [place] [address]    Culture  Setup Time   Final    GRAM POSITIVE RODS Gram Stain Report Called to,Read Back By and Verified With: DILDY,V. AT 1626 ON 05/24/2018 BY EVA AEROBIC BOTTLE ONLY Performed at [name] Performed at [facility], [address] [place], [place] [address]    Culture   Final    CULTURE REINCUBATED FOR BETTER GROWTH Performed at [facility], [place] [address] [place], [place] [address]    Report Status PENDING  Incomplete  Blood culture (routine x 2)     Status: None (Preliminary result)   Collection Time: 05/23/18  4:12 AM  Result  Value Ref Range Status   Specimen Description LEFT ANTECUBITAL  Final   Special Requests   Final    BOTTLES DRAWN AEROBIC AND ANAEROBIC Blood Culture adequate volume   Culture   Final    NO GROWTH 3 DAYS Performed at [facility], [address] [place], [place] [address]    Report Status PENDING  Incomplete  Urine Culture     Status: Abnormal (Preliminary result)   Collection Time: 05/23/18  4:34 AM  Result Value Ref Range Status   Specimen Description URINE, CLEAN CATCH  Final   Special Requests   Final    NONE Performed at [facility], [address], [place], [place] [address]    Culture 70,000 COLONIES/mL ESCHERICHIA COLI (A)  Final   Report Status PENDING  Incomplete         Radiology Studies: No results found.      Scheduled Meds: . enoxaparin (LOVENOX) injection  40 mg Subcutaneous Q24H  . mouth rinse  15 mL Mouth Rinse BID  . nystatin    Topical BID   Continuous Infusions: . dextrose 5 % and 0.45% NaCl 50 mL/hr at 05/26/18 0223  . meropenem (MERREM) IV 1 g (05/26/18 0542)     LOS: 3 days    Time spent: 30 minutes    Xitlali Kastens Darleen Crocker, DO [facility] Pager [phone]  If 7PM-7AM, please contact night-coverage [email or web address] Password [facility] 05/26/2018, 12:13 PM

## 2018-05-27 DIAGNOSIS — A419 Sepsis, unspecified organism: Secondary | ICD-10-CM

## 2018-05-27 DIAGNOSIS — R6520 Severe sepsis without septic shock: Secondary | ICD-10-CM

## 2018-05-27 LAB — URINE CULTURE: Culture: 70000 — AB

## 2018-05-27 NOTE — Progress Notes (Signed)
Daily Progress Note   Patient Name: Carmen Christian       Date: 05/27/2018 DOB: 1928/07/13  Age: 85 y.o. MRN#: [id number] Attending Physician: Roxan Hockey, MD Primary Care Physician: Patient, No Pcp Per Admit Date: 05/23/2018  Reason for Consultation/Follow-up: Establishing goals of care  Subjective:  Upon arrival to room, patient alert. She is pleasantly confused with baseline dementia. She does answer simple questions and follows commands. I reoriented her to place and situation.  Sat with patient and assisted with feeding. No signs of aspiration. Enjoyed chocolate magic cup.   Spoke with niece, [name], via telephone. [name] was originally planning to come from [place] today but will not be here until Friday. Discussed course of hospitalization including diagnoses and interventions. [name] was glad to hear that [place] is more awake and accepting food. Encouraged [name] to continue to advocate for her at SNF since she is end-stage dementia and requires total assist with eating and ADL's. Discussed dementia disease trajectory.   [name] confirms that her aunt had hospice services at [facility] prior to hospitalization. Discussed hospice philosophy and role of comfort, quality, dignity and preventing re-current hospitalization. Discussed role of symptom management. [name] agrees to restart hospice services when discharged back to SNF.   Answered questions and concerns.  Length of Stay: 4  Current Medications: Scheduled Meds:  . enoxaparin (LOVENOX) injection  40 mg Subcutaneous Q24H  . mouth rinse  15 mL Mouth Rinse BID  . nystatin   Topical BID    Continuous Infusions: . dextrose 5 % and 0.45% NaCl 50 mL/hr at 05/27/18 1039  . meropenem (MERREM) IV 1 g (05/27/18 0552)    PRN Meds: acetaminophen  **OR** acetaminophen, ondansetron **OR** ondansetron (ZOFRAN) IV  Physical Exam Vitals signs and nursing note reviewed.  Constitutional:      General: She is awake.     Appearance: She is ill-appearing.  HENT:     Head: Normocephalic and atraumatic.  Pulmonary:     Effort: No tachypnea, accessory muscle usage or respiratory distress.  Abdominal:     Tenderness: There is no abdominal tenderness.  Skin:    General: Skin is warm and dry.  Neurological:     Mental Status: She is alert. She is disoriented.     Comments: Awake. Disoriented with baseline dementia. Denies pain. Answers simple  questions and simple commands.  Psychiatric:        Attention and Perception: She is inattentive.        Speech: Speech is delayed.        Behavior: Behavior is cooperative.        Cognition and Memory: Cognition is impaired.            Vital Signs: BP 108/63 (BP Location: Left Arm)   Pulse 71   Temp 97.6 F (36.4 C) (Oral)   Resp 18   Ht '5\' 1"'  (1.549 m)   Wt 69.9 kg   SpO2 98%   BMI 29.12 kg/m  SpO2: SpO2: 98 % O2 Device: O2 Device: Room Air O2 Flow Rate: O2 Flow Rate (L/min): 3 L/min  Intake/output summary:   Intake/Output Summary (Last 24 hours) at 05/27/2018 1339 Last data filed at 05/27/2018 0900 Gross per 24 hour  Intake 240 ml  Output 1150 ml  Net -910 ml   LBM: Last BM Date: 05/26/18 Baseline Weight: Weight: 73 kg Most recent weight: Weight: 69.9 kg       Palliative Assessment/Data: PPS 30%      Patient Active Problem List   Diagnosis Date Noted  . Dysphagia   . Sepsis with encephalopathy without septic shock ([place])   . Urinary tract infection without hematuria   . Acute encephalopathy 05/23/2018  . Advanced care planning/counseling discussion   . Goals of care, counseling/discussion   . Palliative care by specialist   . Acute cystitis without hematuria   . Weakness   . UTI due to extended-spectrum beta lactamase (ESBL) producing Escherichia coli 04/19/2018  .  Leukocytosis 04/19/2018  . Anemia 04/19/2018  . Dementia ([place]) 04/19/2018  . CHF (congestive heart failure) ([place]) 04/19/2018  . Chronic ulcer of left heel ([place]) 04/19/2018  . Multiple myeloma ([place]) 04/19/2018  . Hypotension 04/19/2018  . Dehydration 04/19/2018    Palliative Care Assessment & Plan   Patient Profile: 85 y.o. female  with past medical history of dementia, multiple myeloma, renal disorder, anxiety, depression, CHF, anemia, chronic left heel ulcer admitted on 05/23/2018 with acute worsening confusion and behavioral disturbances superimposed on underlying dementia. Current [facility] hospice patient at SNF. CT head and chest xray negative for acute findings. Urine culture noted E. Coli. In ED, niece confirmed with physician DNR/DNI but desire for treatment with antibiotics and IVF. Palliative medicine consultation for goals of care/EOL care.   Assessment: Acute metabolic encephalopathy Advanced dementia ESBL UTI Anemia  Recommendations/Plan:  DNR/DNI. Continue current plan of care until medically maximized. Patient likely back at baseline.   Appreciate SLP evaluation. Patient will require full assist with feeds.   Current [facility] hospice patient. F/u GOC with niece, [name], who lives in [place] and will not be local until Friday. Discussed hospice philosophy and goal of comfort focused care. [name] agrees to re-initiate hospice services at [facility] on discharge.   Code Status: DNR   Code Status Orders  (From admission, onward)         Start     Ordered   05/23/18 0830  Do not attempt resuscitation (DNR)  Continuous    Question Answer Comment  In the event of cardiac or respiratory ARREST Do not call a "code blue"   In the event of cardiac or respiratory ARREST Do not perform Intubation, CPR, defibrillation or ACLS   In the event of cardiac or respiratory ARREST Use medication by any route, position, wound care, and other measures to relive pain  and suffering. May use  oxygen, suction and manual treatment of airway obstruction as needed for comfort.      05/23/18 0831        Code Status History    Date Active Date Inactive Code Status Order ID Comments User Context   04/19/2018 1515 04/23/2018 2040 DNR [id number]  Murlean Iba, MD ED       Prognosis:   Unable to determine  Discharge Planning:  [place] with Hospice  Care plan was discussed with RN, Dr. Denton Brick, niece [name]  Thank you for allowing the Palliative Medicine Team to assist in the care of this patient.   Time In: 1215 Time Out: 1300 Total Time 45 Prolonged Time Billed  no      Greater than 50%  of this time was spent counseling and coordinating care related to the above assessment and plan.  Ihor Dow, FNP-C Palliative Medicine Team  Phone: [phone] Fax: [phone]  Please contact Palliative Medicine Team phone at [phone] for questions and concerns.

## 2018-05-27 NOTE — Progress Notes (Signed)
Patient Demographics:    Carmen Christian, is a 85 y.o. female, DOB - Jun 11, 1928, GGY:[id number]  Admit date - 05/23/2018   Admitting Physician Carmen Darleen Crocker, DO  Outpatient Primary MD for the patient is Carmen Christian  LOS - 4   Chief Complaint  Patient presents with  . Altered Mental Status        Subjective:    Carmen Christian today has no fevers, no emesis,  No chest pain, poor appetite and poor oral intake  Assessment  & Plan :    Active Problems:   UTI due to extended-spectrum beta lactamase (ESBL) producing Escherichia coli   Anemia   Dementia (HCC)   Hypotension   Acute encephalopathy   Sepsis with encephalopathy without septic shock (HCC)   Urinary tract infection without hematuria   Dysphagia   Brief Narrative:  Christian HPI: Carmen Christian a 85 y.o.femalewith medical history significant foranemia, reported paraplegia, chronic left heel ulcer, dyslipidemia, and severe dementia who is brought from her skilled nursing facility for acutely worsening confusion and some behavioral disturbances over the last 1 day which is superimposed on her baseline severe dementia. She is reportedly under hospice care at this facility. Patient is essentially noncommunicative due to dementia and is otherwise unable to give any other history. No reported fevers or chills otherwise noted.  Patient has been admitted with acute metabolic encephalopathy superimposed on advanced dementia presumed to be likely due to UTI from ESBL and has been started on Merrem. She appears that she may be in the active stages of the dying process,and palliative has evaluated with recommendations for SLP evaluation was recommended dysphagia 1 diet.  Palliative care planning to meet with niece who will arrive on 2/19 for further discussion regarding management.  Further culture data and sensitivities still  pending.   Plan 1. Acute toxic metabolic encephalopathy superimposed on advanced dementia secondary to   ESBL E. coli UTI--- patient with chronic indwelling Foley catheter , continue meropenem, plan to discharge back to facility on IV Invanz.  2) Corynebacterium bacteremia-----blood cultures from 05/23/2018 with diphtheroids/Corynebacterium species-----patient on IV meropenem as above #1     3)Advanced dementia- Will focus on comfort measures for any respiratory distress or anxiety. Swallow evaluation prior to diet initiation. Avoid aggressive measures.  4)Anemia. Currently appears to be stable. No overt bleeding noted. Repeat CBC has remained stable. Will avoid further lab work in a.m.  5)Social/Ethics--palliative care consult appreciated patient needs in [place] was supposed to be coming for face-to-face conversation about goals of care, patient is DNR/DNI--- previously under hospice care  6) catheter associated ESBL E. coli UTI--- present on admission, patient had Foley catheter prior to arrival in this facility, Foley was changed in ED on admission on 05/23/2018  DVT prophylaxis:Lovenox Code Status:DNR Family Communication:Palliative for spoken with niece who is currently in [place] Disposition Plan:Continue ongoing IV antibiotics treatment for UTI and bacteremia Consultants:  Palliative care  Procedures:  None  Antimicrobials:  Merrem 2/15->  Disposition/Need for in-Hospital Stay- patient unable to be discharged at this time due to IV antibiotic treatment for Corynebacterium bacteremia and E. coli ESBL UTI, as well as requirement/need for IV fluids--- Anticipate discharge back to facility with hospice/palliative care in 1 to 2  days     Lab Results  Component Value Date   PLT 163 05/25/2018    Inpatient Medications  Scheduled Meds: . enoxaparin (LOVENOX) injection  40 mg Subcutaneous Q24H  . mouth rinse  15 mL Mouth Rinse BID  . nystatin    Topical BID   Continuous Infusions: . dextrose 5 % and 0.45% NaCl 50 mL/hr at 05/27/18 1039  . meropenem (MERREM) IV 1 g (05/27/18 1735)   PRN Meds:.acetaminophen **OR** acetaminophen, ondansetron **OR** ondansetron (ZOFRAN) IV    Anti-infectives (From admission, onward)   Start     Dose/Rate Route Frequency Ordered Stop   05/23/18 1800  meropenem (MERREM) 1 g in sodium chloride 0.9 % 100 mL IVPB     1 g 200 mL/hr over 30 Minutes Intravenous Every 12 hours 05/23/18 0953     05/23/18 0515  meropenem (MERREM) 2 g in sodium chloride 0.9 % 100 mL IVPB     2 g 200 mL/hr over 30 Minutes Intravenous  Once 05/23/18 0511 05/23/18 0640        Objective:   Vitals:   05/26/18 2333 05/27/18 0528 05/27/18 0753 05/27/18 1323  BP:  122/79 131/82 108/63  Pulse: 75 85 80 71  Resp: (!) 24 20 20 18   Temp: 98.3 F (36.8 C) 98.2 F (36.8 C) 97.9 F (36.6 C) 97.6 F (36.4 C)  TempSrc: Oral Oral Oral Oral  SpO2: 100% (!) 85% 100% 98%  Weight:      Height:        Wt Readings from Last 3 Encounters:  05/25/18 69.9 kg  04/23/18 73.4 kg  05/22/17 81.6 kg     Intake/Output Summary (Last 24 hours) at 05/27/2018 1817 Last data filed at 05/27/2018 1801 Gross Christian 24 hour  Intake 2577.05 ml  Output 1450 ml  Net 1127.05 ml     Physical Exam Patient is examined daily including today on 05/27/18 , exams remain the same as of yesterday except that has changed   Gen:- Awake , does not appear uncomfortable HEENT:- Carmen Christian, No sclera icterus Neck-Supple Neck,No JVD,.  Lungs-  CTAB , fair symmetrical air movement CV- S1, S2 normal, regular  Abd-  +ve B.Sounds, Abd Soft, No tenderness,    Extremity/Skin:- No  edema, pedal pulses present  Psych-affect is appropriate, oriented x3 Neuro-no new focal deficits, no tremors GU--- Foley catheter with clear urine  Data Review:   Micro Results Recent Results (from the past 240 hour(s))  Blood culture (routine x 2)     Status: Abnormal   Collection  Time: 05/23/18  4:12 AM  Result Value Ref Range Status   Specimen Description   Final    RIGHT ANTECUBITAL Performed at [facility], [address], [place], [place] [address]    Special Requests   Final    BOTTLES DRAWN AEROBIC AND ANAEROBIC Blood Culture adequate volume Performed at [facility], [address], [place], [place] [address]    Culture  Setup Time   Final    GRAM POSITIVE RODS Gram Stain Report Called to,Read Back By and Verified With: DILDY,V. AT 1626 ON 05/24/2018 BY EVA AEROBIC BOTTLE ONLY Performed at [facility] Performed at [facility], [address] [place], [place] [address]    Culture (A)  Final    DIPHTHEROIDS(CORYNEBACTERIUM SPECIES) Standardized susceptibility testing for this organism is not available. Performed at [facility], [place] [address], [place], [place] [address]    Report Status 05/26/2018 FINAL  Final  Blood culture (routine x 2)     Status: None (Preliminary result)   Collection Time: 05/23/18  4:12 AM  Result Value Ref Range Status   Specimen Description LEFT ANTECUBITAL  Final   Special Requests   Final    BOTTLES DRAWN AEROBIC AND ANAEROBIC Blood Culture adequate volume   Culture   Final    NO GROWTH 4 DAYS Performed at [facility], [address], [place], [place] [address]    Report Status PENDING  Incomplete  Urine Culture     Status: Abnormal   Collection Time: 05/23/18  4:34 AM  Result Value Ref Range Status   Specimen Description   Final    URINE, CLEAN CATCH Performed at [facility], [address], [place], [place] [address]    Special Requests   Final    NONE Performed at [facility], [address] [place], [place] [address]    Culture (A)  Final    70,000 COLONIES/mL ESCHERICHIA COLI Confirmed Extended Spectrum Beta-Lactamase Producer (ESBL).  In bloodstream infections from ESBL organisms, carbapenems are preferred over piperacillin/tazobactam. They are shown to have a lower risk of mortality.     Report Status 05/27/2018 FINAL  Final   Organism ID, Bacteria ESCHERICHIA COLI (A)  Final      Susceptibility   Escherichia coli - MIC*    AMPICILLIN >=32 RESISTANT Resistant     CEFAZOLIN >=64 RESISTANT Resistant     CEFTRIAXONE >=64 RESISTANT Resistant     CIPROFLOXACIN >=4 RESISTANT Resistant     GENTAMICIN <=1 SENSITIVE Sensitive     IMIPENEM <=0.25 SENSITIVE Sensitive     NITROFURANTOIN <=16 SENSITIVE Sensitive     TRIMETH/SULFA >=320 RESISTANT Resistant     AMPICILLIN/SULBACTAM >=32 RESISTANT Resistant     PIP/TAZO 16 SENSITIVE Sensitive     Extended ESBL POSITIVE Resistant     * 70,000 COLONIES/mL ESCHERICHIA COLI    Radiology Reports Ct Head Wo Contrast  Result Date: 05/23/2018 CLINICAL DATA:  Altered mental status EXAM: CT HEAD WITHOUT CONTRAST TECHNIQUE: Contiguous axial images were obtained from the base of the skull through the vertex without intravenous contrast. COMPARISON:  Head CT April 19, 2018 and brain MRI April 20, 2018 FINDINGS: Brain: Moderate diffuse atrophy is stable. There is no intracranial mass, hemorrhage, extra-axial fluid collection, or midline shift. There is patchy small vessel disease in the centra semiovale bilaterally. No acute infarct is demonstrable. Vascular: No hyperdense vessel. There is calcification in each carotid siphon region. Skull: The bony calvarium appears intact. Sinuses/Orbits: There is mucosal thickening in several ethmoid air cells. Other visualized paranasal sinuses are clear. Visualized orbits appear symmetric bilaterally. Other: Mastoid air cells are clear. IMPRESSION: Stable atrophy with periventricular small vessel disease. No acute infarct. No mass or hemorrhage. There are foci of arterial vascular calcification. There is mucosal thickening in several ethmoid air cells. Electronically Signed   By: Lowella Grip III M.D.   On: 05/23/2018 05:05   Dg Chest Portable 1 View  Result Date: 05/23/2018 CLINICAL DATA:  Altered mental  status EXAM: PORTABLE CHEST 1 VIEW COMPARISON:  April 19, 2018 FINDINGS: There is scarring in the right base region. There is no edema or consolidation. Heart is upper normal in size with pulmonary vascularity normal. There is aortic atherosclerosis. There is advanced arthropathy in both shoulders. IMPRESSION: Scarring right base. No edema or consolidation. Stable cardiac silhouette. Advanced arthropathy in both shoulders, stable. Aortic Atherosclerosis (ICD10-I70.0). Electronically Signed   By: Lowella Grip III  M.D.   On: 05/23/2018 05:03     CBC Recent Labs  Lab 05/23/18 0412 05/24/18 0706 05/25/18 0525  WBC 6.8 7.9 8.7  HGB 9.6* 9.0* 8.9*  HCT 32.7* 30.3* 30.0*  PLT 189 150 163  MCV 84.7 85.1 82.9  MCH 24.9* 25.3* 24.6*  MCHC 29.4* 29.7* 29.7*  RDW 19.6* 19.2* 19.2*  LYMPHSABS 2.9  --   --   MONOABS 0.2  --   --   EOSABS 0.2  --   --   BASOSABS 0.0  --   --     Chemistries  Recent Labs  Lab 05/23/18 0412 05/24/18 0706 05/25/18 0525 05/26/18 0620  NA 143 145 144 141  K 3.8 3.5 3.8 3.7  CL 102 108 107 106  CO2 33* 31 29 29   GLUCOSE 65* 88 78 78  BUN 16 12 11 8   CREATININE 0.81 0.73 0.69 0.71  CALCIUM 8.8* 8.0* 8.4* 8.1*  AST 21  --   --   --   ALT 17  --   --   --   ALKPHOS 68  --   --   --   BILITOT 0.4  --   --   --    ------------------------------------------------------------------------------------------------------------------ No results for input(s): CHOL, HDL, LDLCALC, TRIG, CHOLHDL, LDLDIRECT in the last 72 hours.  Lab Results  Component Value Date   HGBA1C 6.0 07/18/2013   ------------------------------------------------------------------------------------------------------------------ No results for input(s): TSH, T4TOTAL, T3FREE, THYROIDAB in the last 72 hours.  Invalid input(s): FREET3 ------------------------------------------------------------------------------------------------------------------ No results for input(s): VITAMINB12,  FOLATE, FERRITIN, TIBC, IRON, RETICCTPCT in the last 72 hours.  Coagulation profile Recent Labs  Lab 05/23/18 0412  INR 0.93    Roxan Hockey M.D on 05/27/2018 at 6:17 PM  Go to [email or web address] - for contact info  [facility] - Office  [phone]

## 2018-05-28 DIAGNOSIS — F0390 Unspecified dementia without behavioral disturbance: Secondary | ICD-10-CM

## 2018-05-28 LAB — CULTURE, BLOOD (ROUTINE X 2)
Culture: NO GROWTH
Special Requests: ADEQUATE

## 2018-05-28 MED ORDER — SODIUM CHLORIDE 0.9 % IV SOLN
1.0000 g | Freq: Once | INTRAVENOUS | Status: AC
  Administered 2018-05-29: 1000 mg via INTRAVENOUS
  Filled 2018-05-28: qty 1

## 2018-05-28 NOTE — Clinical Social Work Note (Addendum)
Clinical Social Work Assessment  Patient Details  Name: Carmen Christian MRN: [id number] Date of Birth: 1928/10/14  Date of referral:   05/27/2018               Reason for consult:  Current facility/end of life issues               Permission sought to share information with:   Facility, family Permission granted to share information::     Name::     [facility],  [name], niece  Agency::    Relationship::    Contact Information:   Housing/Transportation Living arrangements for the past 2 months:  [place] of Information:  [name], neice Patient Interpreter Needed:  None Criminal Activity/Legal Involvement Pertinent to Current Situation/Hospitalization:  N/A Significant Relationships:  ALF staff, niece Lives with:  ALF Do you feel safe going back to the place where you live?  Yes Need for family participation in patient care:  Yes  Care giving concerns:  [name] at [facility] states they have been able to care for patient up to this point.  She wants to confer with providers/family prior to pt return to make sure everyone is on the same page, and that facility is still appropriate.   Social Worker assessment / plan:  Patient has been a resident at [facility] for more than two years. She is total care at this time.  [name] indicates that she will come and assess patient today, as well as call [facility] for consult. Patient has support from her niece and her church family. Niece is planning on being in town tomorrow to follow up with Palliative NP here who has been in phone contact with her over the past 4 days.  Patient/Family's Response to care:  Pt has dementia  Patient/Family's Understanding of and Emotional Response to Diagnosis, Current Treatment, and Prognosis:  Pt has dementia  Emotional Assessment Appearance:  Appears stated age Attitude/Demeanor/Rapport:   Affect (typically observed):  Calm Orientation:  Self Alcohol / Substance use:  N/A Psych  involvement (Current and /or in the community):  N/A  Discharge Needs  Concerns to be addressed:  D/C planning Readmission within the last 30 days:  Yes Current discharge risk:  None Barriers to Discharge:  No barriers identified  Trish Mage, LCSW 05/28/2018, 9:07 AM

## 2018-05-28 NOTE — Progress Notes (Signed)
Patient O2 sat found to be 81% on 2L of oxygen but patient was talking and breathing through mouth and did not comprehend to close mouth and take deep breaths. Patient eventually was able to close mouth and oxygen saturation was found to be within normal limits.

## 2018-05-28 NOTE — Progress Notes (Signed)
Patient Demographics:    Carmen Christian, is a 85 y.o. female, DOB - [birth year], BMW:[id number]  Admit date - 05/23/2018   Admitting Physician Pratik Darleen Crocker, DO  Outpatient Primary MD for the patient is Patient, No Pcp Per  LOS - 5   Chief Complaint  Patient presents with  . Altered Mental Status        Subjective:    [facility] today has no fevers, no emesis, no new complaints  Assessment  & Plan :    Active Problems:   UTI due to extended-spectrum beta lactamase (ESBL) producing Escherichia coli   Anemia   Dementia (HCC)   Hypotension   Acute encephalopathy   Sepsis with encephalopathy without septic shock (HCC)   Urinary tract infection without hematuria   Dysphagia   Brief Narrative:  Per HPI: Carmen Stadleris a 85 y.o.femalewith medical history significant foranemia, reported paraplegia, chronic left heel ulcer, dyslipidemia, and severe dementia who is brought from her skilled nursing facility for acutely worsening confusion and some behavioral disturbances over the last 1 day which is superimposed on her baseline severe dementia. She is reportedly under hospice care at this facility. Patient is essentially noncommunicative due to dementia and is otherwise unable to give any other history. No reported fevers or chills otherwise noted.  Patient has been admitted with acute metabolic encephalopathy superimposed on advanced dementia presumed to be likely due to UTI from ESBL and has been started on Merrem. She appears that she may be in the active stages of the dying process,and palliative has evaluated with recommendations for SLP evaluation was recommended dysphagia 1 diet.  Palliative care planning to meet with niece who will arrive on 2/19 for further discussion regarding management.  Further culture data and sensitivities still pending.   Plan 1. Acute toxic metabolic  encephalopathy superimposed on advanced dementia secondary to   ESBL E. coli UTI--- patient with chronic indwelling Foley catheter , continue meropenem, plan to discharge back to facility [facility]) on 05/29/18 after 1 dose of IV Invanz.----Day 7/ last day of Carbapenem antibiotic will be 05/29/2018 at which time patient can be discharged back to ALF  2. 2) Corynebacterium bacteremia-----blood cultures from 05/23/2018 with diphtheroids/Corynebacterium species-----patient on IV meropenem as above #1, Day 7/ last day of Carbapenem antibiotic will be 05/29/2018 at which time patient can be discharged back to ALF    3)Advanced dementia- Will focus on comfort measures for any respiratory distress or anxiety. Swallow evaluation prior to diet initiation. Avoid aggressive measures.  4)Anemia. Currently appears to be stable. No overt bleeding noted. Repeat CBC has remained stable. Will avoid further lab work in a.m.  5)Social/Ethics--palliative care consult appreciated patient needs in [place] was supposed to be coming for face-to-face conversation about goals of care, patient is DNR/DNI--- previously under hospice care  6) catheter associated ESBL E. coli UTI--- present on admission, patient had Foley catheter prior to arrival in this facility, Foley was changed in ED on admission on 05/23/2018  DVT prophylaxis:Lovenox Code Status:DNR Family Communication:Palliative for spoken with niece who is currently in [place], she plans to come visit to 21 2020 3. Disposition Plan:Continue ongoing IV antibiotics treatment for UTI and bacteremia, Day 7/ last day of Carbapenem antibiotic will be 05/29/2018  at which time patient can be discharged back to ALF   Consultants:  Palliative care  Procedures:  None  Antimicrobials:  Merrem 2/15->  Disposition/Need for in-Hospital Stay- patient unable to be discharged at this time due to IV antibiotic treatment for Corynebacterium bacteremia and  E. coli ESBL UTI, as well as requirement/need for IV fluids--- 4. Day 7/ last day of Carbapenem antibiotic will be 05/29/2018 at which time patient can be discharged back to ALF---Anticipate discharge back to facility with hospice/palliative care      Lab Results  Component Value Date   PLT 163 05/25/2018    Inpatient Medications  Scheduled Meds: . enoxaparin (LOVENOX) injection  40 mg Subcutaneous Q24H  . mouth rinse  15 mL Mouth Rinse BID  . nystatin   Topical BID   Continuous Infusions: . dextrose 5 % and 0.45% NaCl 50 mL/hr at 05/27/18 1039  . [START ON 05/29/2018] ertapenem    . meropenem (MERREM) IV 1 g (05/28/18 5784)   PRN Meds:.acetaminophen **OR** acetaminophen, ondansetron **OR** ondansetron (ZOFRAN) IV    Anti-infectives (From admission, onward)   Start     Dose/Rate Route Frequency Ordered Stop   05/29/18 0600  ertapenem (INVANZ) 1,000 mg in sodium chloride 0.9 % 100 mL IVPB     1 g 200 mL/hr over 30 Minutes Intravenous  Once 05/28/18 1158     05/23/18 1800  meropenem (MERREM) 1 g in sodium chloride 0.9 % 100 mL IVPB     1 g 200 mL/hr over 30 Minutes Intravenous Every 12 hours 05/23/18 0953 05/29/18 0559   05/23/18 0515  meropenem (MERREM) 2 g in sodium chloride 0.9 % 100 mL IVPB     2 g 200 mL/hr over 30 Minutes Intravenous  Once 05/23/18 0511 05/23/18 0640        Objective:   Vitals:   05/27/18 1323 05/27/18 2323 05/28/18 0448 05/28/18 1511  BP: 108/63 121/74 135/81 (!) 141/95  Pulse: 71 71 64 73  Resp: 18 18 17 20   Temp: 97.6 F (36.4 C) 97.6 F (36.4 C) 98.1 F (36.7 C) 97.6 F (36.4 C)  TempSrc: Oral Oral  Oral  SpO2: 98% 96% 100% (!) 81%  Weight:      Height:        Wt Readings from Last 3 Encounters:  05/25/18 69.9 kg  04/23/18 73.4 kg  05/22/17 81.6 kg     Intake/Output Summary (Last 24 hours) at 05/28/2018 1559 Last data filed at 05/28/2018 1300 Gross per 24 hour  Intake 1051.82 ml  Output 650 ml  Net 401.82 ml     Physical  Exam Patient is examined daily including today on 05/28/18 , exams remain the same as of yesterday except that has changed   Gen:- Awake , does not appear uncomfortable HEENT:- [place].AT, No sclera icterus Neck-Supple Neck,No JVD,.  Lungs-  CTAB , fair symmetrical air movement CV- S1, S2 normal, regular  Abd-  +ve B.Sounds, Abd Soft, No tenderness,    Extremity/Skin:- No  edema, pedal pulses present  Psych-affect is appropriate, oriented x3 Neuro-no new focal deficits, no tremors GU--- Foley catheter with clear urine  Data Review:   Micro Results Recent Results (from the past 240 hour(s))  Blood culture (routine x 2)     Status: Abnormal   Collection Time: 05/23/18  4:12 AM  Result Value Ref Range Status   Specimen Description   Final    RIGHT ANTECUBITAL Performed at [facility], [address], [place],  [place] [address]    Special Requests   Final    BOTTLES DRAWN AEROBIC AND ANAEROBIC Blood Culture adequate volume Performed at [facility], [address], [place], [place] [address]    Culture  Setup Time   Final    GRAM POSITIVE RODS Gram Stain Report Called to,Read Back By and Verified With: DILDY,V. AT 1626 ON 05/24/2018 BY EVA AEROBIC BOTTLE ONLY Performed at [facility] Performed at [facility], [address], [place], [place] [address]    Culture (A)  Final    DIPHTHEROIDS(CORYNEBACTERIUM SPECIES) Standardized susceptibility testing for this organism is not available. Performed at [facility], [place] [address] [place], [place] [address]    Report Status 05/26/2018 FINAL  Final  Blood culture (routine x 2)     Status: None   Collection Time: 05/23/18  4:12 AM  Result Value Ref Range Status   Specimen Description LEFT ANTECUBITAL  Final   Special Requests   Final    BOTTLES DRAWN AEROBIC AND ANAEROBIC Blood Culture adequate volume   Culture   Final    NO GROWTH 5 DAYS Performed at [facility], [address], [place], [place] [address]    Report  Status 05/28/2018 FINAL  Final  Urine Culture     Status: Abnormal   Collection Time: 05/23/18  4:34 AM  Result Value Ref Range Status   Specimen Description   Final    URINE, CLEAN CATCH Performed at [facility], [address], [place], [place] [address]    Special Requests   Final    NONE Performed at [facility], [address] [place], [place] [address]    Culture (A)  Final    70,000 COLONIES/mL ESCHERICHIA COLI Confirmed Extended Spectrum Beta-Lactamase Producer (ESBL).  In bloodstream infections from ESBL organisms, carbapenems are preferred over piperacillin/tazobactam. They are shown to have a lower risk of mortality.    Report Status 05/27/2018 FINAL  Final   Organism ID, Bacteria ESCHERICHIA COLI (A)  Final      Susceptibility   Escherichia coli - MIC*    AMPICILLIN >=32 RESISTANT Resistant     CEFAZOLIN >=64 RESISTANT Resistant     CEFTRIAXONE >=64 RESISTANT Resistant     CIPROFLOXACIN >=4 RESISTANT Resistant     GENTAMICIN <=1 SENSITIVE Sensitive     IMIPENEM <=0.25 SENSITIVE Sensitive     NITROFURANTOIN <=16 SENSITIVE Sensitive     TRIMETH/SULFA >=320 RESISTANT Resistant     AMPICILLIN/SULBACTAM >=32 RESISTANT Resistant     PIP/TAZO 16 SENSITIVE Sensitive     Extended ESBL POSITIVE Resistant     * 70,000 COLONIES/mL ESCHERICHIA COLI    Radiology Reports Ct Head Wo Contrast  Result Date: 05/23/2018 CLINICAL DATA:  Altered mental status EXAM: CT HEAD WITHOUT CONTRAST TECHNIQUE: Contiguous axial images were obtained from the base of the skull through the vertex without intravenous contrast. COMPARISON:  Head CT April 19, 2018 and brain MRI April 20, 2018 FINDINGS: Brain: Moderate diffuse atrophy is stable. There is no intracranial mass, hemorrhage, extra-axial fluid collection, or midline shift. There is patchy small vessel disease in the centra semiovale bilaterally. No acute infarct is demonstrable. Vascular: No hyperdense vessel. There is calcification in each  carotid siphon region. Skull: The bony calvarium appears intact. Sinuses/Orbits: There is mucosal thickening in several ethmoid air cells. Other visualized paranasal sinuses are clear. Visualized orbits appear symmetric bilaterally. Other: Mastoid air cells are clear. IMPRESSION: Stable atrophy with periventricular small vessel disease. No acute  infarct. No mass or hemorrhage. There are foci of arterial vascular calcification. There is mucosal thickening in several ethmoid air cells. Electronically Signed   By: Lowella Grip III M.D.   On: 05/23/2018 05:05   Dg Chest Portable 1 View  Result Date: 05/23/2018 CLINICAL DATA:  Altered mental status EXAM: PORTABLE CHEST 1 VIEW COMPARISON:  April 19, 2018 FINDINGS: There is scarring in the right base region. There is no edema or consolidation. Heart is upper normal in size with pulmonary vascularity normal. There is aortic atherosclerosis. There is advanced arthropathy in both shoulders. IMPRESSION: Scarring right base. No edema or consolidation. Stable cardiac silhouette. Advanced arthropathy in both shoulders, stable. Aortic Atherosclerosis (ICD10-I70.0). Electronically Signed   By: Lowella Grip III M.D.   On: 05/23/2018 05:03     CBC Recent Labs  Lab 05/23/18 0412 05/24/18 0706 05/25/18 0525  WBC 6.8 7.9 8.7  HGB 9.6* 9.0* 8.9*  HCT 32.7* 30.3* 30.0*  PLT 189 150 163  MCV 84.7 85.1 82.9  MCH 24.9* 25.3* 24.6*  MCHC 29.4* 29.7* 29.7*  RDW 19.6* 19.2* 19.2*  LYMPHSABS 2.9  --   --   MONOABS 0.2  --   --   EOSABS 0.2  --   --   BASOSABS 0.0  --   --     Chemistries  Recent Labs  Lab 05/23/18 0412 05/24/18 0706 05/25/18 0525 05/26/18 0620  NA 143 145 144 141  K 3.8 3.5 3.8 3.7  CL 102 108 107 106  CO2 33* 31 29 29   GLUCOSE 65* 88 78 78  BUN 16 12 11 8   CREATININE 0.81 0.73 0.69 0.71  CALCIUM 8.8* 8.0* 8.4* 8.1*  AST 21  --   --   --   ALT 17  --   --   --   ALKPHOS 68  --   --   --   BILITOT 0.4  --   --   --     ------------------------------------------------------------------------------------------------------------------ No results for input(s): CHOL, HDL, LDLCALC, TRIG, CHOLHDL, LDLDIRECT in the last 72 hours.  Lab Results  Component Value Date   HGBA1C 6.0 07/18/2013   ------------------------------------------------------------------------------------------------------------------ No results for input(s): TSH, T4TOTAL, T3FREE, THYROIDAB in the last 72 hours.  Invalid input(s): FREET3 ------------------------------------------------------------------------------------------------------------------ No results for input(s): VITAMINB12, FOLATE, FERRITIN, TIBC, IRON, RETICCTPCT in the last 72 hours.  Coagulation profile Recent Labs  Lab 05/23/18 0412  INR 0.93    Roxan Hockey M.D on 05/28/2018 at 3:59 PM  Go to [email or web address] - for contact info  [facility] - Office  [phone]

## 2018-05-29 ENCOUNTER — Encounter (HOSPITAL_COMMUNITY): Payer: Self-pay

## 2018-05-29 DIAGNOSIS — I959 Hypotension, unspecified: Secondary | ICD-10-CM

## 2018-05-29 LAB — BASIC METABOLIC PANEL
Anion gap: 8 (ref 5–15)
BUN: 5 mg/dL — ABNORMAL LOW (ref 8–23)
CALCIUM: 8.3 mg/dL — AB (ref 8.9–10.3)
CO2: 27 mmol/L (ref 22–32)
Chloride: 105 mmol/L (ref 98–111)
Creatinine, Ser: 0.56 mg/dL (ref 0.44–1.00)
GFR calc Af Amer: >60 mL/min (ref >60)
GFR calc non Af Amer: >60 mL/min (ref >60)
Glucose, Bld: 101 mg/dL — ABNORMAL HIGH (ref 70–99)
Potassium: 3.4 mmol/L — ABNORMAL LOW (ref 3.5–5.1)
SODIUM: 140 mmol/L (ref 135–145)

## 2018-05-29 LAB — CBC
HCT: 31.2 % — ABNORMAL LOW (ref 36.0–46.0)
Hemoglobin: 9.3 g/dL — ABNORMAL LOW (ref 12.0–15.0)
MCH: 24.7 pg — AB (ref 26.0–34.0)
MCHC: 29.8 g/dL — ABNORMAL LOW (ref 30.0–36.0)
MCV: 82.8 fL (ref 80.0–100.0)
Platelets: 207 10*3/uL (ref 150–400)
RBC: 3.77 MIL/uL — ABNORMAL LOW (ref 3.87–5.11)
RDW: 19.8 % — AB (ref 11.5–15.5)
WBC: 9.7 10*3/uL (ref 4.0–10.5)
nRBC: 0 % (ref 0.0–0.2)

## 2018-05-29 MED ORDER — POTASSIUM CHLORIDE CRYS ER 20 MEQ PO TBCR
40.0000 meq | EXTENDED_RELEASE_TABLET | Freq: Once | ORAL | Status: AC
  Administered 2018-05-29: 40 meq via ORAL

## 2018-05-29 MED ORDER — HYDROCODONE-ACETAMINOPHEN 7.5-325 MG/15ML PO SOLN: 10.0000 mL | Freq: Four times a day (QID) | ORAL | 0 refills | Status: AC | PRN

## 2018-05-29 MED ORDER — ONDANSETRON HCL 4 MG PO TABS: 4.0000 mg | ORAL_TABLET | Freq: Four times a day (QID) | ORAL | 0 refills | Status: AC | PRN

## 2018-05-29 MED ORDER — LORAZEPAM 0.5 MG PO TABS: 0.5000 mg | ORAL_TABLET | Freq: Three times a day (TID) | ORAL | 0 refills | Status: AC | PRN

## 2018-05-29 MED ORDER — NYSTATIN 100000 UNIT/GM EX POWD: Freq: Two times a day (BID) | CUTANEOUS | 2 refills | Status: AC

## 2018-05-29 NOTE — Discharge Instructions (Signed)
1) please reconsult palliative and hospice services at facility to follow patient concurrently 2) please encourage oral intake as tolerated 3) please give Ensure or boost nutritional supplements 3 times a day

## 2018-05-29 NOTE — Discharge Summary (Signed)
[address] [place], is a 85 y.o. female  DOB January 21, 1929  MRN [id number].  Admission date:  05/23/2018  Admitting Physician  [place], DO  Discharge Date:  05/29/2018   Primary MD  Patient, No Pcp Per  Recommendations for primary care physician for things to follow:   1) please reconsult palliative and hospice services at facility to follow patient concurrently 2) please encourage oral intake as tolerated 3) please give Ensure or boost nutritional supplements 3 times a day  Admission Diagnosis  Hypothermia, initial encounter [T68.XXXA] Sepsis with encephalopathy without septic shock, due to unspecified organism ([place]) [A41.9, R65.20, G93.40]   Discharge Diagnosis  Hypothermia, initial encounter [T68.XXXA] Sepsis with encephalopathy without septic shock, due to unspecified organism ([place]) [A41.9, R65.20, G93.40]    Active Problems:   UTI due to extended-spectrum beta lactamase (ESBL) producing Escherichia coli   Anemia   Dementia (HCC)   Hypotension   Acute encephalopathy   Sepsis with encephalopathy without septic shock ([place])   Urinary tract infection without hematuria   Dysphagia      Past Medical History:  Diagnosis Date  . Anemia   . CHF (congestive heart failure) ([place])   . Dementia ([place])   . Depression   . Dizziness   . High cholesterol   . Hypercholesterolemia   . Multiple myeloma ([place])   . Renal disorder   . Vitamin D deficiency     No past surgical history on file.     HPI  from the history and physical done on the day of admission:    Patient coming from: SNF  Chief Complaint: AMS  HPI: Carmen Christian is a 85 y.o. female with medical history significant for anemia, reported paraplegia, chronic left heel ulcer, dyslipidemia, and severe dementia who is brought from her skilled nursing facility for acutely worsening confusion and some behavioral disturbances over the last 1 day  which is superimposed on her baseline severe dementia.  She is reportedly under hospice care at this facility.  Patient is essentially noncommunicative due to dementia and is otherwise unable to give any other history.  No reported fevers or chills otherwise noted.   ED Course: Vital signs demonstrate hypothermia with temperature 90.5 F and hypotension with a blood pressure 86/53.  She arrived with a Foley catheter in place that appears to be contaminated and was recently treated for a UTI with ESBL and discharged after 5 days of treatment on meropenem on 1/16.  She has been started on some IV fluid and given meropenem empirically with urine cultures pending.  Chest x-ray with no significant findings and CT head with stable atrophy and no acute findings noted.  Laboratory data otherwise unremarkable.  EDP has spoken with niece who is apparently power of attorney and is agreeable to fluid and antibiotic administration and conservative measures.  She has been placed on a Bair hugger due to hypothermia.   Hospital Course:   Brief Narrative: Per HPI: Carmen Christian a 85 y.o.femalewith medical history significant foranemia, reported paraplegia, chronic  left heel ulcer, dyslipidemia, and severe dementia who is brought from her skilled nursing facility for acutely worsening confusion and some behavioral disturbances over the last 1 day which is superimposed on her baseline severe dementia. She is reportedly under hospice care at this facility. Patient is essentially noncommunicative due to dementia and is otherwise unable to give any other history. No reported fevers or chills otherwise noted.  Patient has been admitted with acute metabolic encephalopathy superimposed on advanced dementia presumed to be likely due to UTI from ESBL and has been started on Merrem. She appears that she may be in the active stages of the dying process,and palliative has evaluated , Palliative care planning to meet with  niece who lives in [place] when she is able to arrive here   Plan 1. Acute toxic metabolic encephalopathy superimposed on advanced dementia secondary to   ESBL E. coli UTI--- patient with chronic indwelling Foley catheter , treated with meropenem,  discharge back to facility [facility]) on 05/29/18 after 1 dose of IV Invanz.----Day 7/ last day of Carbapenem antibiotic is 05/29/2018 at which time patient can be discharged back to ALF  2) Corynebacterium bacteremia-----blood cultures from 05/23/2018 with diphtheroids/Corynebacterium species-----patient was treated with IV meropenem and subsequently Invanz as above #1, Day 7/ last day of Carbapenem antibiotic is 05/29/2018 at which time patient can be discharged back to ALF   3)Advanced dementia- c/n comfort measures for any respiratory distress or anxiety.  Returning to facility with hospice involvement  4)Anemia. Currently appears to be stable. No overt bleeding noted. Repeat CBC has remained stable. Will avoid further lab work in a.m.  5)Social/Ethics--palliative care consult appreciated patient Niece in [place] was supposed to be coming for face-to-face conversation about goals of care, patient is DNR/DNI--- previously under hospice care  6) catheter associated ESBL E. coli UTI--- present on admission, patient had Foley catheter prior to arrival in this facility, Foley was changed in ED on admission on 05/23/2018, antibiotics as above #1   Code Status:DNR Family Communication:Palliative for spoken with niece who is currently in [place], she plans to come visit  this weekend possibly  Disposition Plan:, Day 7/ last day of Carbapenem antibiotic is 05/29/2018, patient can be discharged back to ALF with palliative care and hospice services   Consultants:  Palliative care   Procedures:  None  Antimicrobials:  Merrem 2/15-> and iv Invanz on 05/29/18  Discharge Condition: Stable medically, overall prognosis is  poor  Follow UP----with hospice and palliative care services at facility Diet and Activity recommendation:  As advised  Discharge Instructions    Discharge Instructions    Bed rest   Complete by:  As directed    Out of bed to chair with assistance   Call MD for:  difficulty breathing, headache or visual disturbances   Complete by:  As directed    Call MD for:  persistant nausea and vomiting   Complete by:  As directed    Call MD for:  severe uncontrolled pain   Complete by:  As directed    Call MD for:  temperature >100.4   Complete by:  As directed    Diet general   Complete by:  As directed    please encourage oral intake as tolerated  please give Ensure or boost nutritional supplements 3 times a day   Discharge instructions   Complete by:  As directed    1) please reconsult palliative and hospice services at facility to follow patient concurrently 2) please  encourage oral intake as tolerated 3) please give Ensure or boost nutritional supplements 3 times a day        Discharge Medications     Allergies as of 05/29/2018   No Known Allergies     Medication List    STOP taking these medications   furosemide 20 MG tablet Commonly known as:  LASIX     TAKE these medications   albuterol 108 (90 Base) MCG/ACT inhaler Commonly known as:  PROVENTIL HFA;VENTOLIN HFA Inhale 2 puffs into the lungs every 6 (six) hours as needed for wheezing or shortness of breath.   diclofenac sodium 1 % Gel Commonly known as:  VOLTAREN Apply 2 g topically 4 (four) times daily.   divalproex 250 MG DR tablet Commonly known as:  DEPAKOTE Take 250 mg by mouth 2 (two) times daily.   gabapentin 300 MG capsule Commonly known as:  NEURONTIN Take 300 mg by mouth 3 (three) times daily.   HYDROcodone-acetaminophen 7.5-325 mg/15 ml solution Commonly known as:  HYCET Take 10 mLs by mouth 4 (four) times daily as needed for up to 5 days for moderate pain or severe pain.   INTEGRA F PO Take 1  capsule by mouth daily.   LORazepam 0.5 MG tablet Commonly known as:  ATIVAN Take 1 tablet (0.5 mg total) by mouth every 8 (eight) hours as needed for anxiety or sleep. What changed:    when to take this  reasons to take this  additional instructions   meclizine 25 MG tablet Commonly known as:  ANTIVERT Take 25 mg by mouth every 6 (six) hours as needed for dizziness.   MINERIN Crea Apply 1 application topically daily as needed (to lower legs for dry skin).   nystatin powder Commonly known as:  MYCOSTATIN/NYSTOP Apply topically 2 (two) times daily. To affected skin fold areas   ondansetron 4 MG tablet Commonly known as:  ZOFRAN Take 1 tablet (4 mg total) by mouth every 6 (six) hours as needed for nausea or vomiting.   sertraline 100 MG tablet Commonly known as:  ZOLOFT Take 100 mg by mouth daily.   traZODone 50 MG tablet Commonly known as:  DESYREL Take 50 mg by mouth at bedtime.   TYLENOL ARTHRITIS EXT RELIEF PO Take 650 mg by mouth daily as needed (for mild pain).   zinc oxide 20 % ointment Apply 1 application topically 3 (three) times daily as needed for irritation (applied to bilateral buttocks).       Major procedures and Radiology Reports - PLEASE review detailed and final reports for all details, in brief -    Ct Head Wo Contrast  Result Date: 05/23/2018 CLINICAL DATA:  Altered mental status EXAM: CT HEAD WITHOUT CONTRAST TECHNIQUE: Contiguous axial images were obtained from the base of the skull through the vertex without intravenous contrast. COMPARISON:  Head CT April 19, 2018 and brain MRI April 20, 2018 FINDINGS: Brain: Moderate diffuse atrophy is stable. There is no intracranial mass, hemorrhage, extra-axial fluid collection, or midline shift. There is patchy small vessel disease in the centra semiovale bilaterally. No acute infarct is demonstrable. Vascular: No hyperdense vessel. There is calcification in each carotid siphon region. Skull: The bony  calvarium appears intact. Sinuses/Orbits: There is mucosal thickening in several ethmoid air cells. Other visualized paranasal sinuses are clear. Visualized orbits appear symmetric bilaterally. Other: Mastoid air cells are clear. IMPRESSION: Stable atrophy with periventricular small vessel disease. No acute infarct. No mass or hemorrhage. There are foci of arterial  vascular calcification. There is mucosal thickening in several ethmoid air cells. Electronically Signed   By: Lowella Grip III M.D.   On: 05/23/2018 05:05   Dg Chest Portable 1 View  Result Date: 05/23/2018 CLINICAL DATA:  Altered mental status EXAM: PORTABLE CHEST 1 VIEW COMPARISON:  April 19, 2018 FINDINGS: There is scarring in the right base region. There is no edema or consolidation. Heart is upper normal in size with pulmonary vascularity normal. There is aortic atherosclerosis. There is advanced arthropathy in both shoulders. IMPRESSION: Scarring right base. No edema or consolidation. Stable cardiac silhouette. Advanced arthropathy in both shoulders, stable. Aortic Atherosclerosis (ICD10-I70.0). Electronically Signed   By: Lowella Grip III M.D.   On: 05/23/2018 05:03    Micro Results    Recent Results (from the past 240 hour(s))  Blood culture (routine x 2)     Status: Abnormal   Collection Time: 05/23/18  4:12 AM  Result Value Ref Range Status   Specimen Description   Final    RIGHT ANTECUBITAL Performed at [facility], [address], [place], [place] [address]    Special Requests   Final    BOTTLES DRAWN AEROBIC AND ANAEROBIC Blood Culture adequate volume Performed at [facility], [address], [place], [place] [address]    Culture  Setup Time   Final    GRAM POSITIVE RODS Gram Stain Report Called to,Read Back By and Verified With: DILDY,V. AT 1626 ON 05/24/2018 BY EVA AEROBIC BOTTLE ONLY Performed at [facility] Performed at [facility], [address], [place], [place] [address]    Culture (A)   Final    DIPHTHEROIDS(CORYNEBACTERIUM SPECIES) Standardized susceptibility testing for this organism is not available. Performed at [facility], [place] [address] [place], [place] [address]    Report Status 05/26/2018 FINAL  Final  Blood culture (routine x 2)     Status: None   Collection Time: 05/23/18  4:12 AM  Result Value Ref Range Status   Specimen Description LEFT ANTECUBITAL  Final   Special Requests   Final    BOTTLES DRAWN AEROBIC AND ANAEROBIC Blood Culture adequate volume   Culture   Final    NO GROWTH 5 DAYS Performed at [facility], [address], [place], [place] [address]    Report Status 05/28/2018 FINAL  Final  Urine Culture     Status: Abnormal   Collection Time: 05/23/18  4:34 AM  Result Value Ref Range Status   Specimen Description   Final    URINE, CLEAN CATCH Performed at [facility], [address] [place], [place] [address]    Special Requests   Final    NONE Performed at [facility], [address] [place], [place] [address]    Culture (A)  Final    70,000 COLONIES/mL ESCHERICHIA COLI Confirmed Extended Spectrum Beta-Lactamase Producer (ESBL).  In bloodstream infections from ESBL organisms, carbapenems are preferred over piperacillin/tazobactam. They are shown to have a lower risk of mortality.    Report Status 05/27/2018 FINAL  Final   Organism ID, Bacteria ESCHERICHIA COLI (A)  Final      Susceptibility   Escherichia coli - MIC*    AMPICILLIN >=32 RESISTANT Resistant     CEFAZOLIN >=64 RESISTANT Resistant     CEFTRIAXONE >=64 RESISTANT Resistant     CIPROFLOXACIN >=4 RESISTANT Resistant     GENTAMICIN <=1 SENSITIVE Sensitive     IMIPENEM <=0.25 SENSITIVE Sensitive     NITROFURANTOIN <=16 SENSITIVE Sensitive  TRIMETH/SULFA >=320 RESISTANT Resistant     AMPICILLIN/SULBACTAM >=32 RESISTANT Resistant     PIP/TAZO 16 SENSITIVE Sensitive     Extended ESBL POSITIVE Resistant     * 70,000 COLONIES/mL ESCHERICHIA COLI    Today    Subjective    [facility] today has no new concerns , resting comfortably,          Patient has been seen and examined prior to discharge   Objective   Blood pressure (!) 141/95, pulse 73, temperature 97.6 F (36.4 C), temperature source Oral, resp. rate 20, height _0  (1.549 m), weight 69.9 kg, SpO2 (!) 81 %.   Intake/Output Summary (Last 24 hours) at 05/29/2018 1056 Last data filed at 05/29/2018 0553 Gross per 24 hour  Intake 420 ml  Output 1600 ml  Net -1180 ml    Exam Gen:- Awake , does not appear uncomfortable HEENT:- [place].AT, No sclera icterus Neck-Supple Neck,No JVD,.  Lungs-  CTAB , fair symmetrical air movement CV- S1, S2 normal, regular  Abd-  +ve B.Sounds, Abd Soft, No tenderness,    Extremity/Skin:- No  edema, pedal pulses present  Psych-affect is flat, disoriented from time to time  neuro-generalized weakness, but no new focal deficits, no tremors GU--- Foley catheter with clear urine   Data Review   CBC w Diff:  Lab Results  Component Value Date   WBC 9.7 05/29/2018   HGB 9.3 (L) 05/29/2018   HGB 9.6 (L) 07/19/2013   HCT 31.2 (L) 05/29/2018   HCT 30.4 (L) 07/19/2013   PLT 207 05/29/2018   PLT 216 07/19/2013   LYMPHOPCT 43 05/23/2018   LYMPHOPCT 34.5 07/19/2013   MONOPCT 4 05/23/2018   MONOPCT 8.7 07/19/2013   EOSPCT 3 05/23/2018   EOSPCT 3.7 07/19/2013   BASOPCT 0 05/23/2018   BASOPCT 0.9 07/19/2013    CMP:  Lab Results  Component Value Date   NA 140 05/29/2018   NA 143 07/19/2013   K 3.4 (L) 05/29/2018   K 4.0 07/19/2013   CL 105 05/29/2018   CL 113 (H) 07/19/2013   CO2 27 05/29/2018   CO2 24 07/19/2013   BUN 5 (L) 05/29/2018   BUN 7 07/19/2013   CREATININE 0.56 05/29/2018   CREATININE 0.97 07/19/2013   PROT 5.9 (L) 05/23/2018   PROT 7.2 07/17/2013   ALBUMIN 2.6 (L) 05/23/2018   ALBUMIN 2.9 (L) 07/17/2013   BILITOT 0.4 05/23/2018   BILITOT 0.2 07/17/2013   ALKPHOS 68 05/23/2018   ALKPHOS 65 07/17/2013   AST 21 05/23/2018    AST 29 07/17/2013   ALT 17 05/23/2018   ALT 21 07/17/2013  .   Total Discharge time is about 33 minutes  Roxan Hockey M.D on 05/29/2018 at 10:56 AM  Go to [email or web address] -  for contact info  [facility] - Office  [phone]

## 2018-05-29 NOTE — Care Management Important Message (Signed)
Important Message  Patient Details  Name: Carmen Christian MRN: [id number] Date of Birth: [birth year]   Medicare Important Message Given:  Yes    Sherald Barge, RN 05/29/2018, 1:53 PM

## 2018-05-29 NOTE — NC FL2 (Signed)
[place] LEVEL OF CARE SCREENING TOOL     IDENTIFICATION  Patient Name: Carmen Christian Birthdate: March 09, 1929 Sex: female Admission Date (Current Location): 05/23/2018  [facility] and [place] Number:  [employer] and Address:  [place] [address], [place]      Provider Number: [id number]  Attending Physician Name and Address:  Roxan Hockey, MD  Relative Name and Phone Number:       Current Level of Care: Hospital Recommended Level of Care: [place] Prior Approval Number:    Date Approved/Denied:   PASRR Number:    Discharge Plan: Domiciliary (Rest home)    Current Diagnoses: Patient Active Problem List   Diagnosis Date Noted  . Dysphagia   . Sepsis with encephalopathy without septic shock ([place])   . Urinary tract infection without hematuria   . Acute encephalopathy 05/23/2018  . Advanced care planning/counseling discussion   . Goals of care, counseling/discussion   . Palliative care by specialist   . Acute cystitis without hematuria   . Weakness   . UTI due to extended-spectrum beta lactamase (ESBL) producing Escherichia coli 04/19/2018  . Leukocytosis 04/19/2018  . Anemia 04/19/2018  . Dementia ([place]) 04/19/2018  . CHF (congestive heart failure) ([place]) 04/19/2018  . Chronic ulcer of left heel ([place]) 04/19/2018  . Multiple myeloma ([place]) 04/19/2018  . Hypotension 04/19/2018  . Dehydration 04/19/2018    Orientation RESPIRATION BLADDER Height & Weight     Self  O2(Nasal Cannula  2L/M) Incontinent Weight: 69.9 kg Height:  '5\' 1"'$  (154.9 cm)  BEHAVIORAL SYMPTOMS/MOOD NEUROLOGICAL BOWEL NUTRITION STATUS  (none) (none) Incontinent Diet  AMBULATORY STATUS COMMUNICATION OF NEEDS Skin   Total Care Verbally Other (Comment)(L Heel  Full thickness tissue loss)                       Personal Care Assistance Level of Assistance  Bathing, Feeding, Dressing Bathing Assistance: Maximum  assistance Feeding assistance: Maximum assistance Dressing Assistance: Maximum assistance     Functional Limitations Info  Sight, Hearing, Speech Sight Info: Adequate Hearing Info: Adequate Speech Info: Adequate    SPECIAL CARE FACTORS FREQUENCY                       Contractures Contractures Info: Not present    Additional Factors Info  Code Status, Allergies Code Status Info: DNR Allergies Info: NKA              Discharge Medications:  albuterol 108 (90 Base) MCG/ACT inhaler Commonly known as:  PROVENTIL HFA;VENTOLIN HFA Inhale 2 puffs into the lungs every 6 (six) hours as needed for wheezing or shortness of breath.   diclofenac sodium 1 % Gel Commonly known as:  VOLTAREN Apply 2 g topically 4 (four) times daily.   divalproex 250 MG DR tablet Commonly known as:  DEPAKOTE Take 250 mg by mouth 2 (two) times daily.   gabapentin 300 MG capsule Commonly known as:  NEURONTIN Take 300 mg by mouth 3 (three) times daily.   HYDROcodone-acetaminophen 7.5-325 mg/15 ml solution Commonly known as:  HYCET Take 10 mLs by mouth 4 (four) times daily as needed for up to 5 days for moderate pain or severe pain.   INTEGRA F PO Take 1 capsule by mouth daily.   LORazepam 0.5 MG tablet Commonly known as:  ATIVAN Take 1 tablet (0.5 mg total) by mouth every 8 (eight) hours as  needed for anxiety or sleep. What changed:    when to take this  reasons to take this  additional instructions   meclizine 25 MG tablet Commonly known as:  ANTIVERT Take 25 mg by mouth every 6 (six) hours as needed for dizziness.   MINERIN Crea Apply 1 application topically daily as needed (to lower legs for dry skin).   nystatin powder Commonly known as:  MYCOSTATIN/NYSTOP Apply topically 2 (two) times daily. To affected skin fold areas   ondansetron 4 MG tablet Commonly known as:  ZOFRAN Take 1 tablet (4 mg total) by mouth every 6 (six) hours as needed for nausea or  vomiting.   sertraline 100 MG tablet Commonly known as:  ZOLOFT Take 100 mg by mouth daily.   traZODone 50 MG tablet Commonly known as:  DESYREL Take 50 mg by mouth at bedtime.   TYLENOL ARTHRITIS EXT RELIEF PO Take 650 mg by mouth daily as needed (for mild pain).   zinc oxide 20 % ointment Apply 1 application topically 3 (three) times daily as needed for irritation (applied to bilateral buttocks).     Relevant Imaging Results:  Relevant Lab Results:   Additional [place], LCSW

## 2018-05-29 NOTE — Care Management Note (Signed)
Case Management Note  Patient Details  Name: Wakisha Alberts MRN: [id number] Date of Birth: 1928/08/14  Active with [facility]. From [facility]. DC summary faxed to [facility] rep. Needs home O2. [facility] contacted. Hospice will provide order. CSW to make arrangements for return to facility.   Expected Discharge Date:  05/29/18               Expected Discharge Plan:  Assisted Living / Rest Home  In-House Referral:  Clinical Social Work  Discharge planning Services  CM Consult  Post Acute Care Choice:  Durable Medical Equipment Choice offered to:  NA  DME Arranged:  Oxygen DME Agency:  [facility]  HH Arranged:    [place] Agency:     Status of Service:  Completed, signed off  If discussed at [employer], dates discussed:    Additional Comments:  Sherald Barge, RN 05/29/2018, 12:23 PM

## 2018-05-29 NOTE — Progress Notes (Signed)
[facility] called and stated that the oxygen had been delivered and report was given to the nurse and EMS will be called to transport patient.

## 2018-05-29 NOTE — Clinical Social Work Note (Signed)
Confirmed with Ms [name] at [facility] that they are prepared to accept pt back today.  Pt will need O2 upon return, and did not come in with O2.  Nurse case manager arranged for delivery, and facility is to call CSW to alert that O2 has been delivered so that pt can return via EMS.

## 2018-05-30 NOTE — Progress Notes (Signed)
Report called to EMS. Patient being transported to [facility]. Patient stable.

## 2018-07-08 DEATH — deceased

## 2019-09-17 IMAGING — DX DG CHEST 1V PORT
1 series · 1 of 1 positions shown · non-contrast
Comparison: April 19, 2018

CLINICAL DATA: Altered mental status

EXAM:
PORTABLE CHEST 1 VIEW

[chest ap]
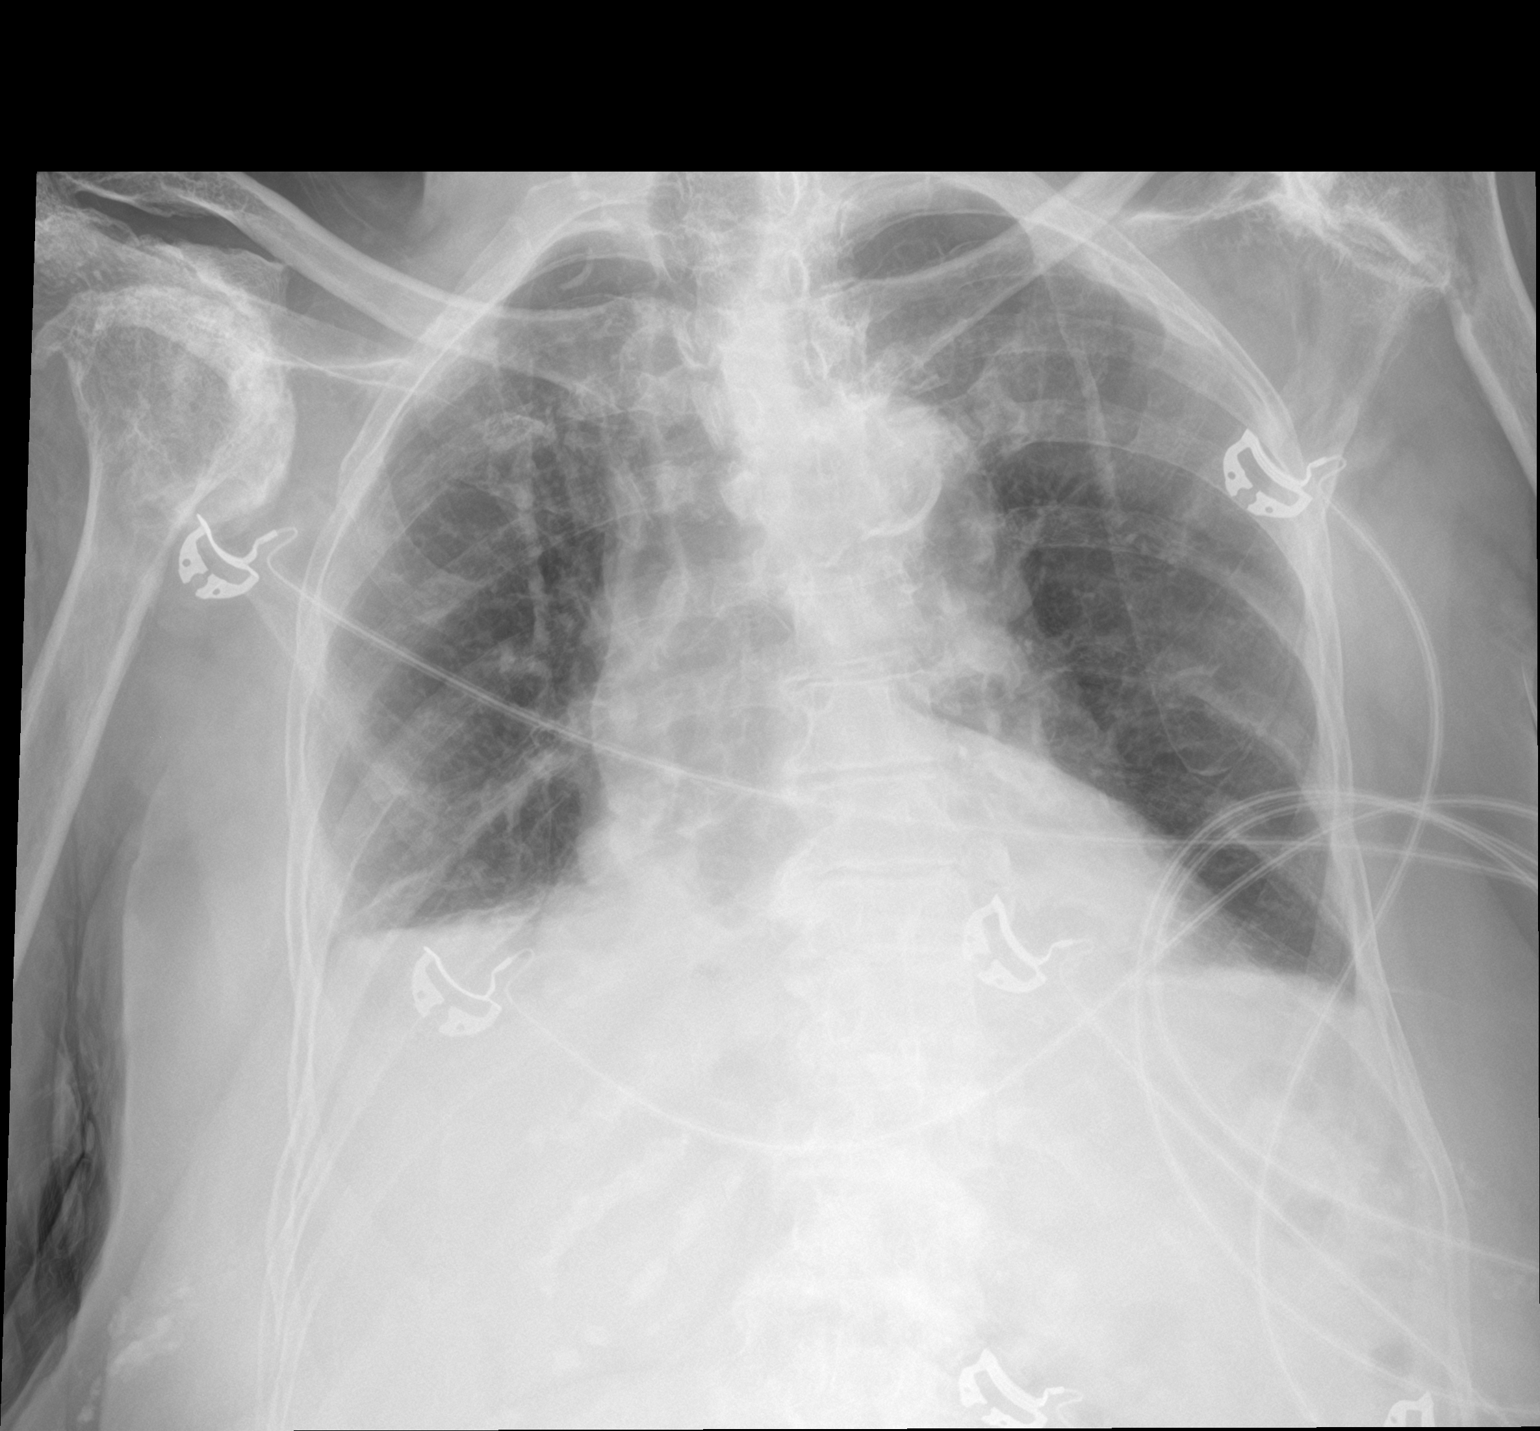

[1 of 1 positions shown; findings below may reference images not displayed]

FINDINGS: There is scarring in the right base region. There is no edema or
consolidation. Heart is upper normal in size with pulmonary
vascularity normal. There is aortic atherosclerosis.

There is advanced arthropathy in both shoulders.
IMPRESSION: Scarring right base. No edema or consolidation. Stable cardiac
silhouette.

Advanced arthropathy in both shoulders, stable.

Aortic Atherosclerosis (D18TB-8MS.S).
